# Patient Record
Sex: Female | Born: 1980 | Race: White | Hispanic: No | Marital: Married | Smoking: Never smoker
Health system: Southern US, Community
[De-identification: ages and names within clinical notes are randomized; demographics above are authoritative.]

## PROBLEM LIST (undated history)

## (undated) DIAGNOSIS — L509 Urticaria, unspecified: Secondary | ICD-10-CM

## (undated) DIAGNOSIS — J309 Allergic rhinitis, unspecified: Secondary | ICD-10-CM

## (undated) DIAGNOSIS — E7800 Pure hypercholesterolemia, unspecified: Secondary | ICD-10-CM

## (undated) HISTORY — DX: Pure hypercholesterolemia, unspecified: E78.00

## (undated) HISTORY — DX: Allergic rhinitis, unspecified: J30.9

## (undated) HISTORY — DX: Urticaria, unspecified: L50.9

---

## 2007-10-09 HISTORY — PX: CHOLECYSTECTOMY: SHX55

## 2016-01-02 ENCOUNTER — Ambulatory Visit (INDEPENDENT_AMBULATORY_CARE_PROVIDER_SITE_OTHER): Payer: BLUE CROSS/BLUE SHIELD | Admitting: Internal Medicine

## 2016-01-02 ENCOUNTER — Encounter: Payer: Self-pay | Admitting: Internal Medicine

## 2016-01-02 VITALS — BP 126/74 | HR 96 | Temp 98.1°F | Resp 20 | Ht 63.78 in | Wt 218.4 lb

## 2016-01-02 DIAGNOSIS — L500 Allergic urticaria: Secondary | ICD-10-CM | POA: Diagnosis not present

## 2016-01-02 DIAGNOSIS — J3089 Other allergic rhinitis: Secondary | ICD-10-CM | POA: Diagnosis not present

## 2016-01-02 DIAGNOSIS — J310 Chronic rhinitis: Secondary | ICD-10-CM | POA: Insufficient documentation

## 2016-01-02 MED ORDER — MONTELUKAST SODIUM 10 MG PO TABS: ORAL_TABLET | ORAL | Status: DC

## 2016-01-02 NOTE — Patient Instructions (Signed)
Allergic rhinitis  Continue montelukast 10 mg daily and fluticasone one spray each nostril twice a day  May use nasal saline lavage prior to nasal sprays  Check specific IgE to environmental allergens.  Consider allergy injections if she is a good candidate  Allergic urticaria  Use free and clear products  Start loratadine (Claritin) 10 mg in the morning and cetirizine (Zyrtec) 10 mg in the evening  May add Benadryl as needed  Continue Singulair as above  We'll check a high-risk food allergen panel but low likelihood of food allergy  If symptoms persist, will consider evaluation for rare causes of urticaria next visit  See if NSAIDs cause itching. List given

## 2016-01-02 NOTE — Assessment & Plan Note (Addendum)
   Use free and clear products  Start loratadine (Claritin) 10 mg in the morning and cetirizine (Zyrtec) 10 mg in the evening  May add Benadryl as needed  Continue Singulair as above  We'll check a high-risk food allergen panel but low likelihood of food allergy  If symptoms persist, will consider evaluation for rare causes of urticaria next visit  See if NSAIDs cause itching. List given

## 2016-01-02 NOTE — Progress Notes (Signed)
Referring provider: No referring provider defined for this encounter.  History of Present Illness:  Lori Williamson is a 35 y.o. female presenting for evaluation of rhinoconjunctivitis and urticaria.  HPI Comments: Rhinitis/recurrent sinus infections: Patient has had symptoms for many years but in the recent past, she feels that her symptoms have intensified. She has 1-2 sinus infections a year with transient improvement with antibiotics. She has had positive skin testing at an outside office which was positive for tree pollen, grass pollen, mold, weed pollen, dust mite, mouse epithelia, horse, cockroach. She has used cetirizine in the past with some improvement. She was recently started on fluticasone nasal spray and montelukast past but is too early to tell if it is helping.  Urticaria: For the last several years, she has had intensifying symptoms. She has tried Zantac daily without improvement. Cetirizine does help but she has breakthrough symptoms on a daily basis. She cannot identify any specific triggers such as foods, medications, products.   No current outpatient prescriptions on file prior to visit.   No current facility-administered medications on file prior to visit.    Assessment and Plan: Allergic rhinitis  Continue montelukast 10 mg daily and fluticasone one spray each nostril twice a day  May use nasal saline lavage prior to nasal sprays  Check specific IgE to environmental allergens.  Consider allergy injections if she is a good candidate  Allergic urticaria  Use free and clear products  Start loratadine (Claritin) 10 mg in the morning and cetirizine (Zyrtec) 10 mg in the evening  May add Benadryl as needed  Continue Singulair as above  We'll check a high-risk food allergen panel but low likelihood of food allergy  If symptoms persist, will consider evaluation for rare causes of urticaria next visit  See if NSAIDs cause itching. List given    Return  in about 4 weeks (around 01/30/2016).  Meds ordered this encounter  Medications  . clarithromycin (BIAXIN) 500 MG tablet    Sig:   . fluticasone (FLONASE) 50 MCG/ACT nasal spray    Sig:   . montelukast (SINGULAIR) 10 MG tablet    Sig:   . 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 No distress.  HENT:  Right Ear: External ear normal.  Left Ear: External ear normal.  Mouth/Throat: Oropharynx is clear and moist.  Nasal membrane edema, erythema with clear rhinorrhea  Eyes: Conjunctivae are normal. Right eye exhibits no discharge. Left eye exhibits no discharge.  Cardiovascular: Normal rate, regular rhythm and normal heart sounds.   No murmur heard. Pulmonary/Chest: Effort normal and breath sounds normal. No respiratory distress. She has no wheezes. She has no rales.  Abdominal: Soft. Bowel sounds are normal.  Musculoskeletal: She exhibits no edema.  Lymphadenopathy:    She has no cervical adenopathy.  Neurological: She is alert.  Skin: Rash: Dermatographia   Vitals reviewed.   Review of systems: Per HPI unless specifically indicated below Review of Systems  Constitutional: Negative for fever, chills, appetite change and unexpected weight change.  HENT: Positive for congestion, postnasal  drip, rhinorrhea, sinus pressure and sneezing. Negative for ear pain and sore throat.   Eyes: Positive for discharge and itching. Negative for pain.  Respiratory: Negative for cough, chest tightness and wheezing.   Cardiovascular: Negative for chest pain and leg swelling.  Gastrointestinal: Negative for vomiting and  diarrhea.  Genitourinary: Negative for difficulty urinating.  Musculoskeletal: Negative for joint swelling and arthralgias.  Skin: Negative for rash.  Allergic/Immunologic: Positive for environmental allergies. Negative for food allergies and immunocompromised state.       Stung by yellow jacket, wasp - no problems No latex allergy  Neurological: Negative for seizures.    Past medical history:  Patient Active Problem List   Diagnosis Date Noted  . Allergic rhinitis 01/02/2016  . Allergic urticaria 01/02/2016    Past surgical history: Past Surgical History  Procedure Laterality Date  . Cholecystectomy  2009    Family history: Family History  Problem Relation Age of Onset  . Diabetes Mother   . Hypertension Mother   . Hypercholesterolemia Mother   . Allergic rhinitis Father   . Allergic rhinitis Brother   . Hypercholesterolemia Brother   . Angioedema Neg Hx   . Asthma Neg Hx   . Eczema Neg Hx   . Immunodeficiency Neg Hx   . Urticaria Neg Hx     Environmental/Social history: She lives in a house that is 34 years of age, she has a non-feather pillow and comforter, there is carpet in the home, there is central air conditioning and heating, there is an outdoor dog, there is a finished basement, she is an Art gallery managerengineer.  Drug Allergies:  Allergies  Allergen Reactions  . Sulfa Antibiotics Nausea And Vomiting    Thank you for the opportunity to care for this patient.  Please do not hesitate to contact me with questions.

## 2016-01-02 NOTE — Assessment & Plan Note (Addendum)
   Continue montelukast 10 mg daily and fluticasone one spray each nostril twice a day  May use nasal saline lavage prior to nasal sprays  Check specific IgE to environmental allergens.  Consider allergy injections if she is a good candidate

## 2016-01-04 ENCOUNTER — Telehealth: Payer: Self-pay | Admitting: *Deleted

## 2016-01-04 NOTE — Telephone Encounter (Signed)
Informed patient to Dr. Shari ProwsBhatti's note written below. Patient was fine with that and will let us know.

## 2016-01-04 NOTE — Telephone Encounter (Signed)
Patient states she is doing every thing she was told to do on Monday. Patient states she is having a lot of ear pressure, ear popping, head congestion, body aches and joint pain. She finished an antibiotic last Tuesday then went to the urgent care again last Friday and got another antibiotic. She states she is getting cold chills and having to stay under a blanket. Her fever has been 98.7 and then again 99.1. Patient states she is worried because normally an antibiotic will make her feel better immediately. Please advise.

## 2016-01-04 NOTE — Telephone Encounter (Signed)
Informed patient that she should finish the antibiotic that was recently prescribed. If she is still having symptoms after that, let us now

## 2016-01-06 LAB — ALLERGENS, ZONE 3
Alternaria Alternata IgE: <0.1 kU/L
Aspergillus Fumigatus IgE: <0.1 kU/L
Bahia Grass IgE: <0.1 kU/L
Bermuda Grass IgE: <0.1 kU/L
Cat Dander IgE: <0.1 kU/L
Cedar, Mountain IgE: <0.1 kU/L
Cockroach, American IgE: <0.1 kU/L
D Pteronyssinus IgE: <0.1 kU/L
Dog Dander IgE: <0.1 kU/L
Elm, American IgE: <0.1 kU/L
Hickory, White IgE: <0.1 kU/L
Johnson Grass IgE: <0.1 kU/L
Kentucky Bluegrass IgE: <0.1 kU/L
Maple/Box Elder IgE: <0.1 kU/L
Mucor Racemosus IgE: <0.1 kU/L
Penicillium Chrysogen IgE: <0.1 kU/L
White Mulberry IgE: <0.1 kU/L

## 2016-01-06 LAB — FOOD ALLERGY PROFILE
Allergen Corn, IgE: <0.1 kU/L
Clam IgE: <0.1 kU/L
Milk IgE: <0.1 kU/L
Soybean IgE: <0.1 kU/L
Walnut IgE: <0.1 kU/L

## 2016-01-09 ENCOUNTER — Telehealth: Payer: Self-pay

## 2016-01-09 NOTE — Telephone Encounter (Signed)
Patient calling back after given lab results. Pt. Wanting to know if taken an antihistamine interfered with the negative lab results and I spoke with Dr. Clydie BraunBhatti and she said no. Pt. Just wanted to make sure.Pt. Had taken a zyrtec around 11:00 am and got her blood drawn around 2 to 3 hours later. Pt. Just frustrated bc she is dealing with on going hives. Pt. Has a return appointment in April with Dr. Clydie BraunBhatti in which they will discuss what to do next.

## 2016-01-30 ENCOUNTER — Telehealth: Payer: Self-pay

## 2016-01-30 ENCOUNTER — Encounter: Payer: Self-pay | Admitting: Internal Medicine

## 2016-01-30 ENCOUNTER — Ambulatory Visit (INDEPENDENT_AMBULATORY_CARE_PROVIDER_SITE_OTHER): Payer: BLUE CROSS/BLUE SHIELD | Admitting: Internal Medicine

## 2016-01-30 VITALS — BP 114/70 | HR 80 | Temp 98.2°F | Resp 16

## 2016-01-30 DIAGNOSIS — J310 Chronic rhinitis: Secondary | ICD-10-CM | POA: Diagnosis not present

## 2016-01-30 DIAGNOSIS — L500 Allergic urticaria: Secondary | ICD-10-CM

## 2016-01-30 LAB — CBC WITH DIFFERENTIAL/PLATELET
BASOS ABS: 0 {cells}/uL (ref 0–200)
Basophils Relative: 0 %
EOS ABS: 85 {cells}/uL (ref 15–500)
Eosinophils Relative: 1 %
HCT: 39.8 % (ref 35.0–45.0)
HEMOGLOBIN: 13.6 g/dL (ref 11.7–15.5)
LYMPHS ABS: 1870 {cells}/uL (ref 850–3900)
Lymphocytes Relative: 22 %
MCH: 30.6 pg (ref 27.0–33.0)
MCHC: 34.2 g/dL (ref 32.0–36.0)
MCV: 89.4 fL (ref 80.0–100.0)
MONOS PCT: 5 %
MPV: 10.1 fL (ref 7.5–12.5)
Monocytes Absolute: 425 cells/uL (ref 200–950)
NEUTROS ABS: 6120 {cells}/uL (ref 1500–7800)
Neutrophils Relative %: 72 %
Platelets: 308 10*3/uL (ref 140–400)
RBC: 4.45 MIL/uL (ref 3.80–5.10)
RDW: 13.1 % (ref 11.0–15.0)
WBC: 8.5 10*3/uL (ref 3.8–10.8)

## 2016-01-30 LAB — COMPREHENSIVE METABOLIC PANEL
ALBUMIN: 4.2 g/dL (ref 3.6–5.1)
ALT: 11 U/L (ref 6–29)
AST: 14 U/L (ref 10–30)
Alkaline Phosphatase: 65 U/L (ref 33–115)
BUN: 8 mg/dL (ref 7–25)
CHLORIDE: 105 mmol/L (ref 98–110)
CO2: 23 mmol/L (ref 20–31)
Calcium: 8.4 mg/dL — ABNORMAL LOW (ref 8.6–10.2)
Creat: 0.81 mg/dL (ref 0.50–1.10)
Glucose, Bld: 122 mg/dL — ABNORMAL HIGH (ref 65–99)
POTASSIUM: 3.9 mmol/L (ref 3.5–5.3)
Sodium: 139 mmol/L (ref 135–146)
TOTAL PROTEIN: 6.1 g/dL (ref 6.1–8.1)
Total Bilirubin: 0.4 mg/dL (ref 0.2–1.2)

## 2016-01-30 LAB — SEDIMENTATION RATE: Sed Rate: 8 mm/hr (ref 0–20)

## 2016-01-30 MED ORDER — MONTELUKAST SODIUM 10 MG PO TABS: 10.0000 mg | ORAL_TABLET | Freq: Every day | ORAL | Status: DC

## 2016-01-30 NOTE — Assessment & Plan Note (Addendum)
   Currently well controlled  Continue cetirizine and loratadine, Singulair  Check for rare causes. In addition to labs above, we'll also check CU index profile, tryptase, alpha gal antibodies  She will check with gastroenterologist regarding use of prevalite

## 2016-01-30 NOTE — Assessment & Plan Note (Addendum)
   Continue to use cetirizine, Claritin, Flonase as needed  Because of recurrent infections, we will screen immune system. Check CBC with differential, CMP, ESR, tetanus/pneumococcus/diphtheria titers, total IgG G/A/M

## 2016-01-30 NOTE — Telephone Encounter (Signed)
Pt. Called me back and understood all information I left on her cell # and pt. Wanted me to call out refill on montelukast to the prevo drug in [place] in which I told pt. I would. Told pt. If she needs me to send office notes once she gets an appointment with Dr. Konrad FelixBrad Thomas in [place] just to give me a call and will send office notes.

## 2016-01-30 NOTE — Progress Notes (Signed)
History of Present Illness: Lori Williamson is a 35 y.o. female presenting for follow-up  HPI Comments: Chronic rhinitis/recurrent sinus infections: Specific IgE to environmental allergens was all negative at last visit. She is taking antihistamines along with montelukast and fluticasone with good symptom control. No interval sinus infections  Urticaria: Labwork was negative for common foods. She is using Claritin in the morning and cetirizine in the evening along with Singulair and has been having good symptom control. In addition she is avoiding NSAIDs. She recently restarted Prevalite and feels that is helping her pruritus along with her IBS.   Current Outpatient Prescriptions on File Prior to Visit  Medication Sig Dispense Refill  . fluticasone (FLONASE) 50 MCG/ACT nasal spray     . montelukast (SINGULAIR) 10 MG tablet     . clarithromycin (BIAXIN) 500 MG tablet Reported on 01/30/2016     No current facility-administered medications on file prior to visit.    Assessment and Plan: Chronic rhinitis  Continue to use cetirizine, Claritin, Flonase as needed  Because of recurrent infections, we will screen immune system. Check CBC with differential, CMP, ESR, tetanus/pneumococcus/diphtheria titers, total IgG G/A/M  Allergic urticaria  Currently well controlled  Continue cetirizine and loratadine, Singulair  Check for rare causes. In addition to labs above, we'll also check CU index profile, tryptase, alpha gal antibodies  She will check with gastroenterologist regarding use of prevalite    Return in about 3 months (around 04/30/2016).  Meds ordered this encounter  Medications  . loratadine (CLARITIN) 10 MG tablet    Sig: Take 10 mg by mouth daily. Every morning  . cetirizine (ZYRTEC) 10 MG tablet    Sig: Take 10 mg by mouth daily. Every evening  . diphenhydrAMINE (BENADRYL) 25 mg capsule    Sig: Take 25 mg by mouth as needed. For breakthru  . vitamin C (ASCORBIC ACID)  500 MG tablet    Sig: Take 500 mg by mouth daily. Takes 2 when sick  . Probiotic Product (PROBIOTIC ADVANCED PO)    Sig: Take by mouth 3 x daily with food.  . cholestyramine light (PREVALITE) 4 GM/DOSE powder    Sig: Take 4 g by mouth daily. It has helped stop the diarrhea and rectal bleeding along the urge to scratch    Physical Exam: BP 114/70 mmHg  Pulse 80  Temp(Src) 98.2 F (36.8 C) (Oral)  Resp 16   Physical Exam  Constitutional: She appears well-developed and well-nourished. No distress.  HENT:  Right Ear: External ear normal.  Left Ear: External ear normal.  Nose: Nose normal.  Mouth/Throat: Oropharynx is clear and moist.  Eyes: Conjunctivae are normal. Right eye exhibits no discharge. Left eye exhibits no discharge.  Cardiovascular: Normal rate, regular rhythm and normal heart sounds.   No murmur heard. Pulmonary/Chest: Effort normal and breath sounds normal. No respiratory distress. She has no wheezes. She has no rales.  Abdominal: Soft. Bowel sounds are normal.  Musculoskeletal: She exhibits no edema.  Lymphadenopathy:    She has no cervical adenopathy.  Neurological: She is alert.  Skin: No rash noted.  Vitals reviewed.   Patient Active Problem List   Diagnosis Date Noted  . Chronic rhinitis 01/02/2016  . Allergic urticaria 01/02/2016    Drug Allergies:  Allergies  Allergen Reactions  . Sulfa Antibiotics Nausea And Vomiting    ROS: Per HPI unless specifically indicated below Review of Systems  Thank you for the opportunity to care for this patient.  Please do not hesitate  to contact me with questions.

## 2016-01-30 NOTE — Telephone Encounter (Signed)
Left message for patients appointment information with  Dr. Chales AbrahamsGupta the [place] Dr. [place] and information regarding PCP information for patient to call me to make sure she understood all information I left on her cell #.

## 2016-01-30 NOTE — Patient Instructions (Signed)
Chronic rhinitis  Continue to use cetirizine, Claritin, Flonase as needed  Because of recurrent infections, we will screen immune system. Check CBC with differential, CMP, ESR, tetanus/pneumococcus/diphtheria titers, total IgG G/A/M  Allergic urticaria  Currently well controlled  Continue cetirizine and loratadine, Singulair  Check for rare causes. In addition to labs above, we'll also check CU index profile, tryptase, alpha gal antibodies  She will check with gastroenterologist regarding use of prevalite

## 2016-01-31 LAB — IGE: IGE (IMMUNOGLOBULIN E), SERUM: 26 kU/L (ref <115)

## 2016-01-31 LAB — TRYPTASE: TRYPTASE: 4.2 ug/L (ref <11)

## 2016-01-31 LAB — IGG, IGA, IGM
IGM, SERUM: 133 mg/dL (ref 52–322)
IgA: 155 mg/dL (ref 69–380)
IgG (Immunoglobin G), Serum: 954 mg/dL (ref 690–1700)

## 2016-01-31 LAB — SEDIMENTATION RATE: Sed Rate: 5 mm/hr (ref 0–20)

## 2016-02-01 LAB — DIPHTHERIA / TETANUS ANTIBODY PANEL
Diphtheria Ab: 0.16 IU/mL
TETANUS TOXIN ANTIBODY, TOTAL: 2.36 [IU]/mL (ref >0.15)

## 2016-02-02 ENCOUNTER — Telehealth: Payer: Self-pay | Admitting: *Deleted

## 2016-02-02 DIAGNOSIS — L500 Allergic urticaria: Secondary | ICD-10-CM

## 2016-02-02 LAB — ALPHA-GAL PANEL
Allergen, Mutton, f88: <0.1 kU/L
Allergen, Pork, f26: <0.1 kU/L
Galactose-alpha-1,3-galactose IgE: <0.1 kU/L (ref <0.35)

## 2016-02-02 LAB — COMPLEMENT, TOTAL: Compl, Total (CH50): 58 U/mL (ref 31–60)

## 2016-02-02 LAB — CP CHRONIC URTICARIA INDEX PANEL
THYROID PEROXIDASE ANTIBODY: 1 [IU]/mL (ref <9)
TSH: 1.52 m[IU]/L
Thyroglobulin Ab: <1 IU/mL (ref <2)

## 2016-02-02 NOTE — Telephone Encounter (Signed)
Left message for pt to call back, need to let her know I reordered a lab test for chronic urticaria and will need to go back to [facility] for redraw.

## 2016-02-02 NOTE — Telephone Encounter (Signed)
[facility] called and said they did not collect enough specimen to do the CP chronic urticaria blood test. Pt will need to go for another blood draw. I will reorder.

## 2016-02-03 LAB — STREP PNEUMONIAE 14 SEROTYPES IGG
Strep pneumo Type 12: <0.3 ug/mL
Strep pneumo Type 19: 0.5 ug/mL
Strep pneumo Type 9: <0.3 ug/mL
Strep pneumoniae Type 1 Abs: 1.5 ug/mL
Strep pneumoniae Type 14 Abs: 2.3 ug/mL
Strep pneumoniae Type 18C Abs: <0.3 ug/mL
Strep pneumoniae Type 23F Abs: 6.6 ug/mL
Strep pneumoniae Type 5 Abs: 1.2 ug/mL
Strep pneumoniae Type 6B Abs: <0.3 ug/mL
Strep pneumoniae Type 7F Abs: <0.3 ug/mL

## 2016-02-03 NOTE — Telephone Encounter (Signed)
Spoke with lab and the histamine release will have to be redrawn, pt is aware. Followed up on the titer results and they will not be back until the first of next week.

## 2016-02-10 ENCOUNTER — Telehealth: Payer: Self-pay

## 2016-02-10 NOTE — Telephone Encounter (Signed)
Patient called this morning to let us know she had talked to her Recruitment consultanthuman resource manager at work and in which they stated that she may have to go on short term disability because of her taking cetirizine at night, loratadine in the morning and benadryl during the day for break through from the hives. 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 I told pt. We are not the one's that put pt.'s on short term disability and it's usually the PCP practice that usually determines that. Pt. Seeing a NP at Dr. Maurine Ministerhomas's office and seeing the GI Dr. On May 16th. The pt. States she works about 60 to 80 hours per week and feels that if she could cut back on her hours until the Dr.'s can figure out regarding the hives and the rectal bleeding in which the pt. Has been having. Please advise

## 2016-02-13 LAB — CP CHRONIC URTICARIA INDEX PANEL
Histamine Release: <16 % (ref <16)
TSH: 1.31 mIU/L
Thyroglobulin Ab: <1 IU/mL (ref <2)
Thyroperoxidase Ab SerPl-aCnc: <1 IU/mL (ref <9)

## 2016-02-13 NOTE — Telephone Encounter (Signed)
At last visit, discussed that patient should be doing cetirizine at night, loratadine in the morning and Singulair at night. On this regimen, she reported good symptom control. 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 Could also consider addition of Xolair for uncontrolled urticaria.

## 2016-02-13 NOTE — Telephone Encounter (Signed)
Spoke to patient to let her know that the regimen that pt. Reported good symptom control with the hives. Told pt. 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 Pt. That the loratadine was making her loopy, lethargic. Dr. Clydie BraunBhatti stated she could do another cetirizine or allegra in place of the loratadine. Pt. Was ok with that. I also told pt. About xolair for hives. Pt. Will check with insurance and get back with us.

## 2016-02-16 ENCOUNTER — Encounter: Payer: Self-pay | Admitting: Internal Medicine

## 2016-02-24 ENCOUNTER — Telehealth: Payer: Self-pay

## 2016-02-24 NOTE — Telephone Encounter (Signed)
Pt. Calling to let us know she saw Dr.Gupta the GI Dr. and pt. Will be having a colonoscopy on Monday. Pt. 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 Chales AbrahamsGupta wants to refer pt to a dermatologist for skin bx's. Dr. Chales AbrahamsGupta doesn't want us to give her xolair injection(s) while doing all these procedures and bx's.  Dr. Chales AbrahamsGupta stated also they maybe referring pt to [facility] or [place]. Told pt. That her Geoffry Paradisexolair was here in office and to let us know if whether or not we will be giving her xolair eventually. I will let the girls know in the shot room.

## 2016-02-27 NOTE — Telephone Encounter (Signed)
Noted  

## 2016-03-30 ENCOUNTER — Telehealth: Payer: Self-pay

## 2016-03-30 NOTE — Telephone Encounter (Signed)
Pt. Had her 1st initial visit on 01/02/16 with Dr.Bhatti for allergic rhinitis and hives.  Since pt had no pcp we referred pt. Out to Dr. Maisie Fushomas for pcp and Dr. [place]ales Abrah[place]Gupta [place]. Dr. Maisie Fushomas ordered a bx material submitted was Flank,right to r/o mastocytosis. Please see results. Pt. Was seeing  Dr.Gupta for GI issues for the last 9 years and had a colon bx which came back benign. Pt also had a pnuemovax injection and in which pt. States she's is feeling much better and with more energy. 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 Pt. States she hives up if she comes out of a shower or if she scratches herself also if she stops taking her antihistamines the hives return.  Please review notes and advise.

## 2016-04-02 NOTE — Telephone Encounter (Signed)
PT CALLED BACK.  SHE HAS ALREADY WORKED WITH [employer] AND GOT XOLAIR APPROVED.  WE HAVE HER FIRST DOSE HERE.  PT SCHEDULED HER FIRST INJECTION ON 04/03/16 AT 10 AM.  DAMITA SPOKE WITH PT AND GAVE ALL INFO FOR FIRST START XOLAIR INJECTION.  DR Beaulah DinningBARDELAS INFORMED.

## 2016-04-02 NOTE — Telephone Encounter (Signed)
LEFT MESSAGE FOR PT TO CALL BACK TO MAKE A RETURN OV WITH DR Beaulah DinningBARDELAS.

## 2016-04-02 NOTE — Telephone Encounter (Signed)
Call patient. The skin biopsy did not show changes seen in mastocytosis. If she continues to have hives, Xolair would be helpful ; however we need to document what medications she is taking and failed for insurance to cover Xolair. She should make a follow-up appointment

## 2016-04-03 ENCOUNTER — Ambulatory Visit (INDEPENDENT_AMBULATORY_CARE_PROVIDER_SITE_OTHER): Payer: BLUE CROSS/BLUE SHIELD

## 2016-04-03 DIAGNOSIS — L500 Allergic urticaria: Secondary | ICD-10-CM | POA: Diagnosis not present

## 2016-04-03 MED ORDER — OMALIZUMAB 150 MG ~~LOC~~ SOLR
300.0000 mg | SUBCUTANEOUS | Status: AC
  Administered 2016-04-03 – 2024-03-23 (×83): 300 mg via SUBCUTANEOUS

## 2016-04-23 ENCOUNTER — Other Ambulatory Visit: Payer: Self-pay | Admitting: Internal Medicine

## 2016-04-26 LAB — STREP PNEUMONIAE 14 SEROTYPES IGG
STREP PNEUMO TYPE 19: 6.3
STREP PNEUMO TYPE 4: 1.6
STREP PNEUMONIAE TYPE 1 ABS: 101.8
STREP PNEUMONIAE TYPE 14 ABS: 68.9
STREP PNEUMONIAE TYPE 18C ABS: 0.3
STREP PNEUMONIAE TYPE 23F ABS: 7.3
STREP PNEUMONIAE TYPE 6B ABS: 19.8
STREP PNEUMONIAE TYPE 7F ABS: 3
STREP PNEUMONIAE TYPE 9N ABS: 6.2
Strep pneumo Type 9: 1.2
Strep pneumoniae Type 3 Abs: 0.9
Strep pneumoniae Type 5 Abs: 96.2
Strep pneumoniae Type 8 Abs: <0.3

## 2016-04-30 ENCOUNTER — Encounter (INDEPENDENT_AMBULATORY_CARE_PROVIDER_SITE_OTHER): Payer: BLUE CROSS/BLUE SHIELD

## 2016-04-30 ENCOUNTER — Encounter: Payer: Self-pay | Admitting: Pediatrics

## 2016-04-30 ENCOUNTER — Ambulatory Visit (INDEPENDENT_AMBULATORY_CARE_PROVIDER_SITE_OTHER): Payer: BLUE CROSS/BLUE SHIELD | Admitting: Pediatrics

## 2016-04-30 VITALS — BP 110/70 | HR 76 | Temp 98.5°F | Resp 18

## 2016-04-30 DIAGNOSIS — L500 Allergic urticaria: Secondary | ICD-10-CM | POA: Diagnosis not present

## 2016-04-30 DIAGNOSIS — L503 Dermatographic urticaria: Secondary | ICD-10-CM | POA: Diagnosis not present

## 2016-04-30 DIAGNOSIS — J310 Chronic rhinitis: Secondary | ICD-10-CM

## 2016-04-30 NOTE — Patient Instructions (Addendum)
Continue on the current medications Continue on Xolair once a month Follow-up in 3 months to see if we can reduce her medications

## 2016-04-30 NOTE — Progress Notes (Signed)
This encounter was created in error - please disregard.

## 2016-04-30 NOTE — Progress Notes (Signed)
  [address] [place] [address] Dept: [phone]  FOLLOW UP NOTE  Patient ID: Lori Williamson, female    DOB: November 24, 1980  Age: 35 y.o. MRN: [id number] Date of Office Visit: 04/30/2016  Assessment  Chief Complaint: Urticaria (f/u hives doing better)  HPI Lori Williamson presents for follow-up of urticaria. She has had 1 Xolair injection and  her symptoms are much improved. She had normal Pneumovax antibody titers. All of her lab work ordered by Dr. Clydie Braun returned  as  normal. 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 Her nasal symptoms are well controlled  Current medications cetirizine 10 mg at night and loratadine 10 mg once in the morning , fluticasone 2 sprays per nostril once a day, montelukast 10 mg once a day.    Drug Allergies:  Allergies  Allergen Reactions  . Sulfa Antibiotics Nausea And Vomiting    Physical Exam: BP 110/70   Pulse 76   Temp 98.5 F (36.9 C) (Oral)   Resp 18    Physical Exam  Constitutional: She is oriented to person, place, and time. She appears well-developed and well-nourished.  HENT:  Eyes normal. Ears normal. Nose normal. Pharynx normal.  Neck: Neck supple. No thyromegaly present.  Cardiovascular:  S1 and S2 normal no murmurs  Pulmonary/Chest:  Clear to percussion and auscultation  Lymphadenopathy:    She has no cervical adenopathy.  Neurological: She is alert and oriented to person, place, and time.  Skin:  A few small hives  Psychiatric: She has a normal mood and affect. Her behavior is normal. Judgment and thought content normal.  Vitals reviewed.   Diagnostics:  none  Assessment and Plan: 1. Allergic urticaria   2. Dermographia   3. Chronic rhinitis       Patient Instructions  Continue on the current medications Continue on Xolair once a month Follow-up in 3 months to see if we can reduce her medications    Return in about 3 months (around 07/31/2016).    Thank you for the opportunity to care  for this patient.  Please do not hesitate to contact me with questions.  Tonette Bihari, M.D.  [facility] [address] [place], [place] [address] [phone]

## 2016-05-28 ENCOUNTER — Ambulatory Visit (INDEPENDENT_AMBULATORY_CARE_PROVIDER_SITE_OTHER): Payer: BLUE CROSS/BLUE SHIELD

## 2016-05-28 DIAGNOSIS — L500 Allergic urticaria: Secondary | ICD-10-CM | POA: Diagnosis not present

## 2016-06-25 ENCOUNTER — Ambulatory Visit (INDEPENDENT_AMBULATORY_CARE_PROVIDER_SITE_OTHER): Payer: BLUE CROSS/BLUE SHIELD

## 2016-06-25 DIAGNOSIS — L500 Allergic urticaria: Secondary | ICD-10-CM | POA: Diagnosis not present

## 2016-07-23 ENCOUNTER — Ambulatory Visit (INDEPENDENT_AMBULATORY_CARE_PROVIDER_SITE_OTHER): Payer: BLUE CROSS/BLUE SHIELD

## 2016-07-23 DIAGNOSIS — L500 Allergic urticaria: Secondary | ICD-10-CM | POA: Diagnosis not present

## 2016-08-20 ENCOUNTER — Ambulatory Visit (INDEPENDENT_AMBULATORY_CARE_PROVIDER_SITE_OTHER): Payer: BLUE CROSS/BLUE SHIELD

## 2016-08-20 ENCOUNTER — Ambulatory Visit: Payer: BLUE CROSS/BLUE SHIELD

## 2016-08-20 DIAGNOSIS — L500 Allergic urticaria: Secondary | ICD-10-CM

## 2016-09-17 ENCOUNTER — Ambulatory Visit (INDEPENDENT_AMBULATORY_CARE_PROVIDER_SITE_OTHER): Payer: BLUE CROSS/BLUE SHIELD

## 2016-09-17 DIAGNOSIS — L500 Allergic urticaria: Secondary | ICD-10-CM | POA: Diagnosis not present

## 2016-10-15 ENCOUNTER — Ambulatory Visit (INDEPENDENT_AMBULATORY_CARE_PROVIDER_SITE_OTHER): Payer: BLUE CROSS/BLUE SHIELD

## 2016-10-15 DIAGNOSIS — L500 Allergic urticaria: Secondary | ICD-10-CM

## 2016-10-15 DIAGNOSIS — L501 Idiopathic urticaria: Secondary | ICD-10-CM

## 2016-10-29 ENCOUNTER — Ambulatory Visit: Payer: BLUE CROSS/BLUE SHIELD | Admitting: Pediatrics

## 2016-10-29 ENCOUNTER — Ambulatory Visit (INDEPENDENT_AMBULATORY_CARE_PROVIDER_SITE_OTHER): Payer: BLUE CROSS/BLUE SHIELD | Admitting: Pediatrics

## 2016-10-29 ENCOUNTER — Encounter: Payer: Self-pay | Admitting: Pediatrics

## 2016-10-29 VITALS — BP 100/74 | HR 86 | Temp 98.2°F | Resp 16

## 2016-10-29 DIAGNOSIS — J310 Chronic rhinitis: Secondary | ICD-10-CM | POA: Diagnosis not present

## 2016-10-29 DIAGNOSIS — L508 Other urticaria: Secondary | ICD-10-CM

## 2016-10-29 DIAGNOSIS — L503 Dermatographic urticaria: Secondary | ICD-10-CM

## 2016-10-29 NOTE — Patient Instructions (Addendum)
Zyrtec 10 mg once a day for itching Fluticasone 2 sprays per nostril once a day if needed for stuffy nose Continue Xolair Lotrimin 1% cream twice a day for 2 weeks to the hyperpigmented area in your back in case this is a skin fungal infection May need an additional 2 weeks of treatment Call me if you are not doing well on this treatment plan

## 2016-10-29 NOTE — Progress Notes (Signed)
  [address] [place] [address] Dept: [phone]  FOLLOW UP NOTE  Patient ID: Lori BlankKaren Loflin-Strayhorn, female    DOB: 04/02/1981  Age: 36 y.o. MRN: [id number] Date of Office Visit: 10/29/2016  Assessment  Chief Complaint: Urticaria  HPI Lori BlankKaren Loflin-Strayhorn presents for follow-up of chronic urticaria. She is doing well with  Xolair injections once a month. She also finds that her sinus disease has  dramatically improved with the Xolair injections. She does not need to use montelukast.  Current medications are cetirizine 10 mg at night, fluticasone 2 sprays per nostril once a day if needed and montelukast 10 mg once a day.   Drug Allergies:  Allergies  Allergen Reactions  . Sulfa Antibiotics Nausea And Vomiting    Physical Exam: BP 100/74   Pulse 86   Temp 98.2 F (36.8 C) (Oral)   Resp 16   SpO2 96%    Physical Exam  Constitutional: She is oriented to person, place, and time. She appears well-developed and well-nourished.  HENT:  Eyes normal. Ears normal. Nose normal. Pharynx normal  Neck: Neck supple. No thyromegaly present.  Cardiovascular:  S1 and S2 normal no murmurs  Pulmonary/Chest:  Clear to percussion and auscultation  Lymphadenopathy:    She has no cervical adenopathy.  Neurological: She is alert and oriented to person, place, and time.  Skin:  No urticaria noted. She had a 2 by 1 inch papular hyperpigmented area in her mid back  Psychiatric: She has a normal mood and affect. Her behavior is normal. Judgment and thought content normal.  Vitals reviewed.   Diagnostics:  none  Assessment and Plan: 1. Chronic urticaria   2. Dermographia   3. Other chronic rhinitis     No orders of the defined types were placed in this encounter.   Patient Instructions  Zyrtec 10 mg once a day for itching Fluticasone 2 sprays per nostril once a day if needed for stuffy nose Continue Xolair Lotrimin 1% cream twice a day for 2 weeks to the hyperpigmented  area in your back in case this is a skin fungal infection May need an additional 2 weeks of treatment Call me if you are not doing well on this treatment plan   Return in about 6 months (around 04/28/2017).    Thank you for the opportunity to care for this patient.  Please do not hesitate to contact me with questions.  Tonette BihariJ. A. Haruka Kowaleski, M.D.  [facility] [address] [place], [place] [address] [phone]

## 2016-11-12 ENCOUNTER — Ambulatory Visit: Payer: BLUE CROSS/BLUE SHIELD

## 2016-11-12 ENCOUNTER — Ambulatory Visit (INDEPENDENT_AMBULATORY_CARE_PROVIDER_SITE_OTHER): Payer: BLUE CROSS/BLUE SHIELD

## 2016-11-12 DIAGNOSIS — L501 Idiopathic urticaria: Secondary | ICD-10-CM | POA: Diagnosis not present

## 2016-11-12 DIAGNOSIS — L508 Other urticaria: Secondary | ICD-10-CM

## 2016-12-07 ENCOUNTER — Ambulatory Visit (INDEPENDENT_AMBULATORY_CARE_PROVIDER_SITE_OTHER): Payer: BLUE CROSS/BLUE SHIELD

## 2016-12-07 DIAGNOSIS — L500 Allergic urticaria: Secondary | ICD-10-CM

## 2016-12-10 ENCOUNTER — Ambulatory Visit: Payer: BLUE CROSS/BLUE SHIELD

## 2017-01-07 ENCOUNTER — Ambulatory Visit (INDEPENDENT_AMBULATORY_CARE_PROVIDER_SITE_OTHER): Payer: BLUE CROSS/BLUE SHIELD

## 2017-01-07 DIAGNOSIS — L500 Allergic urticaria: Secondary | ICD-10-CM | POA: Diagnosis not present

## 2017-02-04 ENCOUNTER — Ambulatory Visit (INDEPENDENT_AMBULATORY_CARE_PROVIDER_SITE_OTHER): Payer: BLUE CROSS/BLUE SHIELD

## 2017-02-04 DIAGNOSIS — L500 Allergic urticaria: Secondary | ICD-10-CM | POA: Diagnosis not present

## 2017-03-01 ENCOUNTER — Ambulatory Visit (INDEPENDENT_AMBULATORY_CARE_PROVIDER_SITE_OTHER): Payer: BLUE CROSS/BLUE SHIELD

## 2017-03-01 DIAGNOSIS — L500 Allergic urticaria: Secondary | ICD-10-CM

## 2017-04-01 ENCOUNTER — Ambulatory Visit (INDEPENDENT_AMBULATORY_CARE_PROVIDER_SITE_OTHER): Payer: BLUE CROSS/BLUE SHIELD | Admitting: *Deleted

## 2017-04-01 DIAGNOSIS — L501 Idiopathic urticaria: Secondary | ICD-10-CM | POA: Diagnosis not present

## 2017-04-01 DIAGNOSIS — J4540 Moderate persistent asthma, uncomplicated: Secondary | ICD-10-CM

## 2017-04-29 ENCOUNTER — Ambulatory Visit (INDEPENDENT_AMBULATORY_CARE_PROVIDER_SITE_OTHER): Payer: BLUE CROSS/BLUE SHIELD | Admitting: Pediatrics

## 2017-04-29 ENCOUNTER — Ambulatory Visit: Payer: BLUE CROSS/BLUE SHIELD

## 2017-04-29 ENCOUNTER — Encounter: Payer: Self-pay | Admitting: Pediatrics

## 2017-04-29 VITALS — BP 108/62 | HR 60 | Temp 97.8°F | Resp 16 | Ht 64.0 in | Wt 163.0 lb

## 2017-04-29 DIAGNOSIS — J4540 Moderate persistent asthma, uncomplicated: Secondary | ICD-10-CM

## 2017-04-29 DIAGNOSIS — J310 Chronic rhinitis: Secondary | ICD-10-CM

## 2017-04-29 DIAGNOSIS — L508 Other urticaria: Secondary | ICD-10-CM

## 2017-04-29 NOTE — Progress Notes (Signed)
  [address] [place] [address] Dept: [phone]  FOLLOW UP NOTE  Patient ID: Lori Williamson, female    DOB: August 07, 1981  Age: 36 y.o. MRN: [id number] Date of Office Visit: 04/29/2017  Assessment  Chief Complaint: Urticaria  HPI Lori Williamson presents for follow-up of chronic urticaria. She has had an excellent response Xolair 300 mg every 28 days. She has not had a sinus infections in the past year. Her nasal symptoms are well controlled.  Current medications are cetirizine 10 mg at night, and fluticasone 2 sprays per nostril once a day if needed   Drug Allergies:  Allergies  Allergen Reactions  . Sulfa Antibiotics Nausea And Vomiting    Physical Exam: BP 108/62   Pulse 60   Temp 97.8 F (36.6 C) (Oral)   Resp 16   Ht 5\' 4"  (1.626 m)   Wt 163 lb (73.9 kg)   BMI 27.98 kg/m    Physical Exam  Constitutional: She is oriented to person, place, and time. She appears well-developed and well-nourished.  HENT:  Eyes normal. Ears normal. Nose normal. Pharynx normal.  Neck: Neck supple. No thyromegaly present.  Cardiovascular:  S1 and S2 normal no murmurs  Pulmonary/Chest:  Clear to percussion and auscultation  Musculoskeletal:  Her right TMJ overlapped  Lymphadenopathy:    She has no cervical adenopathy.  Neurological: She is oriented to person, place, and time.  Skin:  Clear  Psychiatric: She has a normal mood and affect. Judgment and thought content normal.  Vitals reviewed.   Diagnostics:   none  Assessment and Plan: 1. Chronic rhinitis   2. Chronic urticaria        Patient Instructions  Continue on cetirizine 10 mg once a day  Ask  dentist about TMJ syndrome Call me if you are not doing well on this treatment plan Fluticasone 2 sprays per nostril once a day if needed for stuffy nose   Return in about 1 year (around 04/29/2018).    Thank you for the opportunity to care for this patient.  Please do not hesitate to contact  me with questions.  Tonette BihariJ. A. Bardelas, M.D.  [facility] [address] [place], [place] [phone]

## 2017-04-29 NOTE — Patient Instructions (Signed)
Continue on cetirizine 10 mg once a day  Ask  dentist about TMJ syndrome Call me if you are not doing well on this treatment plan Fluticasone 2 sprays per nostril once a day if needed for stuffy nose

## 2017-05-27 ENCOUNTER — Ambulatory Visit (INDEPENDENT_AMBULATORY_CARE_PROVIDER_SITE_OTHER): Payer: BLUE CROSS/BLUE SHIELD | Admitting: *Deleted

## 2017-05-27 DIAGNOSIS — J4540 Moderate persistent asthma, uncomplicated: Secondary | ICD-10-CM

## 2017-05-27 DIAGNOSIS — L501 Idiopathic urticaria: Secondary | ICD-10-CM | POA: Diagnosis not present

## 2017-06-24 ENCOUNTER — Ambulatory Visit (INDEPENDENT_AMBULATORY_CARE_PROVIDER_SITE_OTHER): Payer: BLUE CROSS/BLUE SHIELD | Admitting: *Deleted

## 2017-06-24 DIAGNOSIS — L500 Allergic urticaria: Secondary | ICD-10-CM

## 2017-07-22 ENCOUNTER — Ambulatory Visit (INDEPENDENT_AMBULATORY_CARE_PROVIDER_SITE_OTHER): Payer: BLUE CROSS/BLUE SHIELD

## 2017-07-22 DIAGNOSIS — L500 Allergic urticaria: Secondary | ICD-10-CM

## 2017-08-19 ENCOUNTER — Telehealth: Payer: Self-pay

## 2017-08-19 ENCOUNTER — Ambulatory Visit (INDEPENDENT_AMBULATORY_CARE_PROVIDER_SITE_OTHER): Payer: BLUE CROSS/BLUE SHIELD

## 2017-08-19 DIAGNOSIS — L500 Allergic urticaria: Secondary | ICD-10-CM | POA: Diagnosis not present

## 2017-08-19 NOTE — Telephone Encounter (Signed)
Pt. Was here receiving her xolair today for hives. Pt. Had some concerns about weight loss. She's been on the xolair for about a year and she was at 220 lbs and now is at 150 lbs. She stated she has been exercising and watching what she eats.  She also stated this is the 2nd occurrence that she has felt a little dizzy and weird after her injections. She didn't tell me she felt different from the last injection until she started feeling the same way. She had only eaten a yogurt this morning and I had offered her snacks. She was [place] to [place] and would eat something when she arrived. Pt. Was also wondering could the dose be too high since she had lost 70 lbs and planned to lose 10 more.  Please advise.

## 2017-08-20 NOTE — Telephone Encounter (Signed)
It appears that she is trying to lose weight. She should see her family doctor regarding her weight loss. She should have breakfast on the days that she is going to get the Xolair injection because I don't want her to be hypoglycemic. She may take Zyrtec 10 mg on the mornings that she is going to receive her Xolair injection later that day

## 2017-08-20 NOTE — Telephone Encounter (Signed)
Spoke with pt informed her of doctors note.

## 2017-09-13 ENCOUNTER — Ambulatory Visit (INDEPENDENT_AMBULATORY_CARE_PROVIDER_SITE_OTHER): Payer: BLUE CROSS/BLUE SHIELD

## 2017-09-13 DIAGNOSIS — L500 Allergic urticaria: Secondary | ICD-10-CM

## 2017-09-16 ENCOUNTER — Ambulatory Visit: Payer: Self-pay

## 2017-10-07 ENCOUNTER — Ambulatory Visit: Payer: Self-pay

## 2017-10-14 ENCOUNTER — Ambulatory Visit (INDEPENDENT_AMBULATORY_CARE_PROVIDER_SITE_OTHER): Payer: BLUE CROSS/BLUE SHIELD

## 2017-10-14 DIAGNOSIS — L500 Allergic urticaria: Secondary | ICD-10-CM

## 2017-11-11 ENCOUNTER — Ambulatory Visit (INDEPENDENT_AMBULATORY_CARE_PROVIDER_SITE_OTHER): Payer: BLUE CROSS/BLUE SHIELD

## 2017-11-11 ENCOUNTER — Telehealth: Payer: Self-pay | Admitting: *Deleted

## 2017-11-11 DIAGNOSIS — L500 Allergic urticaria: Secondary | ICD-10-CM

## 2017-11-11 NOTE — Telephone Encounter (Signed)
Patient would like a detailed receipt from her payment today. She needs it to say the dates of service and that it was specifically for her xolair shots. She would like it emailed to her. Email is on file

## 2017-11-12 NOTE — Telephone Encounter (Signed)
Emailed receipt to email address

## 2017-12-09 ENCOUNTER — Ambulatory Visit (INDEPENDENT_AMBULATORY_CARE_PROVIDER_SITE_OTHER): Payer: BLUE CROSS/BLUE SHIELD

## 2017-12-09 DIAGNOSIS — L500 Allergic urticaria: Secondary | ICD-10-CM

## 2018-01-06 ENCOUNTER — Ambulatory Visit (INDEPENDENT_AMBULATORY_CARE_PROVIDER_SITE_OTHER): Payer: BLUE CROSS/BLUE SHIELD | Admitting: *Deleted

## 2018-01-06 DIAGNOSIS — L500 Allergic urticaria: Secondary | ICD-10-CM | POA: Diagnosis not present

## 2018-02-03 ENCOUNTER — Ambulatory Visit (INDEPENDENT_AMBULATORY_CARE_PROVIDER_SITE_OTHER): Payer: BLUE CROSS/BLUE SHIELD

## 2018-02-03 DIAGNOSIS — J4540 Moderate persistent asthma, uncomplicated: Secondary | ICD-10-CM | POA: Diagnosis not present

## 2018-03-04 ENCOUNTER — Ambulatory Visit (INDEPENDENT_AMBULATORY_CARE_PROVIDER_SITE_OTHER): Payer: BLUE CROSS/BLUE SHIELD

## 2018-03-04 DIAGNOSIS — L500 Allergic urticaria: Secondary | ICD-10-CM

## 2018-03-28 ENCOUNTER — Ambulatory Visit (INDEPENDENT_AMBULATORY_CARE_PROVIDER_SITE_OTHER): Payer: BLUE CROSS/BLUE SHIELD

## 2018-03-28 DIAGNOSIS — L500 Allergic urticaria: Secondary | ICD-10-CM | POA: Diagnosis not present

## 2018-04-25 ENCOUNTER — Ambulatory Visit (INDEPENDENT_AMBULATORY_CARE_PROVIDER_SITE_OTHER): Payer: BLUE CROSS/BLUE SHIELD

## 2018-04-25 DIAGNOSIS — L500 Allergic urticaria: Secondary | ICD-10-CM

## 2018-04-28 ENCOUNTER — Ambulatory Visit: Payer: Self-pay

## 2018-05-05 ENCOUNTER — Ambulatory Visit (INDEPENDENT_AMBULATORY_CARE_PROVIDER_SITE_OTHER): Payer: BLUE CROSS/BLUE SHIELD | Admitting: Pediatrics

## 2018-05-05 ENCOUNTER — Encounter: Payer: Self-pay | Admitting: Pediatrics

## 2018-05-05 VITALS — BP 130/86 | HR 78 | Temp 98.3°F | Resp 20 | Ht 64.0 in | Wt 167.2 lb

## 2018-05-05 DIAGNOSIS — J301 Allergic rhinitis due to pollen: Secondary | ICD-10-CM | POA: Insufficient documentation

## 2018-05-05 DIAGNOSIS — L508 Other urticaria: Secondary | ICD-10-CM

## 2018-05-05 DIAGNOSIS — L503 Dermatographic urticaria: Secondary | ICD-10-CM | POA: Diagnosis not present

## 2018-05-05 NOTE — Patient Instructions (Addendum)
Zyrtec 10 mg at night for itching.  You may use Claritin 10 mg in the morning if needed for itching Fluticasone 2 sprays per nostril once a day if needed for stuffy nose See your family doctor regarding your anxiety Continue Xolair 300 mg every 28 days Call us if you are not doing well on this treatment plan

## 2018-05-05 NOTE — Progress Notes (Signed)
  [address] [place] [address] Dept: [phone]  FOLLOW UP NOTE  Patient ID: Lori Williamson, female    DOB: 25-Dec-1980  Age: 37 y.o. MRN: [id number] Date of Office Visit: 05/05/2018  Assessment  Chief Complaint: Urticaria (improved.  c/o anxiety)  HPI Lori Williamson presents for follow-up of chronic urticaria.  She has had an excellent response Xolair 300 mg every 28 days and uses Zyrtec 10 mg at night.  Sometimes she has some itching when she is under stress..  She had severe allergic rhinitis in the springtime and  used Afrin nasal spray for about 3 months.  In 2017 , at another office she was allergic to tree pollens, grass pollens, mold , weed pollen, dust mite, mouse , horse , and cockroach   Drug Allergies:  Allergies  Allergen Reactions  . Sulfa Antibiotics Nausea And Vomiting    Physical Exam: BP 130/86 (BP Location: Right Arm, Patient Position: Sitting, Cuff Size: Normal)   Pulse 78   Temp 98.3 F (36.8 C) (Oral)   Resp 20   Ht 5\' 4"  (1.626 m)   Wt 167 lb 3.2 oz (75.8 kg)   SpO2 97%   BMI 28.70 kg/m    Physical Exam  Constitutional: She is oriented to person, place, and time. She appears well-developed and well-nourished.  HENT:  Eyes normal  Ears normal.  Nose normal.  Pharynx normal.  Neck: Neck supple. No thyromegaly present.  Cardiovascular:  S1-S2 normal no murmurs  Pulmonary/Chest:  Clear to percussion and auscultation  Lymphadenopathy:    She has no cervical adenopathy.  Neurological: She is alert and oriented to person, place, and time.  Skin:  Clear but she had mild dermographia noted  Vitals reviewed.   Diagnostics:    Assessment and Plan: 1. Chronic urticaria   2. Dermographia   3. Seasonal allergic rhinitis due to pollen        Patient Instructions  Zyrtec 10 mg at night for itching.  You may use Claritin 10 mg in the morning if needed for itching Fluticasone 2 sprays per nostril once a day if needed for  stuffy nose See your family doctor regarding your anxiety Continue Xolair 300 mg every 28 days Call us if you are not doing well on this treatment plan   Return in about 1 year (around 05/06/2019).    Thank you for the opportunity to care for this patient.  Please do not hesitate to contact me with questions.  Tonette BihariJ. A. Bardelas, M.D.  [facility] [address] [place], [place] [address] [phone]

## 2018-05-21 ENCOUNTER — Ambulatory Visit: Payer: BLUE CROSS/BLUE SHIELD | Admitting: Family Medicine

## 2018-05-21 ENCOUNTER — Encounter: Payer: Self-pay | Admitting: Family Medicine

## 2018-05-21 VITALS — BP 120/82 | HR 84 | Temp 98.4°F | Ht 64.0 in | Wt 166.0 lb

## 2018-05-21 DIAGNOSIS — F411 Generalized anxiety disorder: Secondary | ICD-10-CM

## 2018-05-21 NOTE — Patient Instructions (Addendum)
Please consider counseling. Contact [phone] to schedule an appointment or inquire about cost/insurance coverage.  Coping skills Choose 5 that work for you:  Take a deep breath  Count to 20  Read a book  Do a puzzle  Meditate  Bake  Sing  Knit  Garden  Pray  Go outside  Call a friend  Listen to music  Take a walk  Color  Send a note  Take a bath  Watch a movie  Be alone in a quiet place  Pet an animal  Visit a friend  Journal  Exercise  Stretch   Aim to do some physical exertion for 150 minutes per week. This is typically divided into 5 days per week, 30 minutes per day. The activity should be enough to get your heart rate up. Anything is better than nothing if you have time constraints.  Let us know if you need anything. 

## 2018-05-21 NOTE — Progress Notes (Signed)
Pre visit review using our clinic review tool, if applicable. No additional management support is needed unless otherwise documented below in the visit note. 

## 2018-05-21 NOTE — Progress Notes (Signed)
Chief Complaint  Patient presents with  . New Patient (Initial Visit)       New Patient Visit SUBJECTIVE: HPI: Lori Williamson is an 37 y.o.female who is being seen for establishing care.  The patient was previously seen at a different office.  The patient has a long-standing history of anxiety and situational IBS.  This dates back to her early adolescent years.  She does not have a family history and does not report being on any treatment for this in the past.  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  She does interact with many physicians.  Prior to entering their offices, she will be anxious and doomsday scenarios will run through her mind.  Work is definitely a stressor for her.  She also has a history of dermographism.  She feels that her stress/anxiety is contributing to this condition as well.  She is not following with a counselor or psychologist.  Allergies  Allergen Reactions  . Sulfa Antibiotics Nausea And Vomiting    Past Medical History:  Diagnosis Date  . Allergic rhinitis   . Hypercholesterolemia   . Urticaria    Past Surgical History:  Procedure Laterality Date  . CHOLECYSTECTOMY  2009   Family History  Problem Relation Age of Onset  . Diabetes Mother   . Hypertension Mother   . Hypercholesterolemia Mother   . Allergic rhinitis Father   . Allergic rhinitis Brother   . Hypercholesterolemia Brother   . Angioedema Neg Hx   . Asthma Neg Hx   . Eczema Neg Hx   . Immunodeficiency Neg Hx   . Urticaria Neg Hx    Allergies  Allergen Reactions  . Sulfa Antibiotics Nausea And Vomiting    Current Outpatient Medications:  .  cetirizine (ZYRTEC) 10 MG tablet, Take 10 mg by mouth daily. Every evening, Disp: , Rfl:  .  diphenhydrAMINE (BENADRYL) 25 mg capsule, Take 25 mg by mouth as needed. For breakthru, Disp: , Rfl:  .  EPINEPHrine 0.3 mg/0.3 mL IJ SOAJ injection, , Disp: , Rfl:  .   fluticasone (FLONASE) 50 MCG/ACT nasal spray, Place 1 spray into both nostrils daily as needed for allergies (may use two sprays each nostril once a day as needed.). , Disp: , Rfl:  .  loratadine (CLARITIN) 10 MG tablet, Take 10 mg by mouth daily. Every morning, Disp: , Rfl:  .  montelukast (SINGULAIR) 10 MG tablet, Take 1 tablet (10 mg total) by mouth at bedtime., Disp: 34 tablet, Rfl: 5 .  Probiotic Product (PROBIOTIC ADVANCED PO), Take by mouth 3 x daily with food., Disp: , Rfl:  .  vitamin C (ASCORBIC ACID) 500 MG tablet, Take 500 mg by mouth daily. Takes 2 when sick, Disp: , Rfl:  .  XOLAIR 150 MG injection, Inject 150 mg into the skin every 28 (twenty-eight) days., Disp: , Rfl:   ROS Cardiovascular: Denies chest pain  Respiratory: Denies dyspnea   OBJECTIVE: BP 120/82 (BP Location: Left Arm, Patient Position: Sitting, Cuff Size: Normal)   Pulse 84   Temp 98.4 F (36.9 C) (Oral)   Ht 5\' 4"  (1.626 m)   Wt 166 lb (75.3 kg)   SpO2 97%   BMI 28.49 kg/m   Constitutional: -  VS reviewed -  Well developed, well nourished, appears stated age -  No apparent distress  Psychiatric: -  Oriented to person, place, and time -  Memory intact -  Affect and mood normal -  Fluent conversation, good eye contact -  Judgment and insight age appropriate  Eye: -  Conjunctivae clear, no discharge -  Pupils symmetric, round, reactive to light  ENMT: -  MMM    Pharynx moist, no exudate, no erythema  Neck: -  No gross swelling, no palpable masses -  Thyroid midline, not enlarged, mobile, no palpable masses  Cardiovascular: -  RRR -  No LE edema  Respiratory: -  Normal respiratory effort, no accessory muscle use, no retraction -  Breath sounds equal, no wheezes, no ronchi, no crackles  Neurological:  -  CN II - XII grossly intact -  DTRs equal and symmetric throughout, no clonus, no cerebellar signs -  Sensation grossly intact to light touch, equal bilaterally  Musculoskeletal: -  No clubbing, no  cyanosis -  Gait normal  Skin: -  No significant lesion on inspection -  Warm and dry to palpation   ASSESSMENT/PLAN: GAD (generalized anxiety disorder)  Patient instructed to sign release of records form from her previous PCP. Counseled on treatment options for above.  She would like to investigate counseling more.  Her vocation provides free counseling visits up to a certain number.  I [facility].  Anxiety coping mechanisms were also provided in her AVS.  Counseled on exercise being a supplement for managing this.  I did encourage she research medications used for this.  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 Patient should return in 3 months, sooner if needed. The patient voiced understanding and agreement to the plan.  Greater than 30 minutes were spent face to face with the patient with greater than 50% of this time spent counseling on diagnosis, prognosis, treatment options, exercise, and follow-up.   Jilda Rocheicholas [place], DO 05/21/18  3:28 PM

## 2018-05-26 ENCOUNTER — Ambulatory Visit (INDEPENDENT_AMBULATORY_CARE_PROVIDER_SITE_OTHER): Payer: BLUE CROSS/BLUE SHIELD | Admitting: *Deleted

## 2018-05-26 DIAGNOSIS — L500 Allergic urticaria: Secondary | ICD-10-CM

## 2018-06-16 ENCOUNTER — Ambulatory Visit: Payer: Self-pay

## 2018-06-23 ENCOUNTER — Ambulatory Visit (INDEPENDENT_AMBULATORY_CARE_PROVIDER_SITE_OTHER): Payer: BLUE CROSS/BLUE SHIELD

## 2018-06-23 DIAGNOSIS — L500 Allergic urticaria: Secondary | ICD-10-CM | POA: Diagnosis not present

## 2018-07-21 ENCOUNTER — Ambulatory Visit (INDEPENDENT_AMBULATORY_CARE_PROVIDER_SITE_OTHER): Payer: BLUE CROSS/BLUE SHIELD

## 2018-07-21 DIAGNOSIS — L500 Allergic urticaria: Secondary | ICD-10-CM | POA: Diagnosis not present

## 2018-08-18 ENCOUNTER — Encounter: Payer: Self-pay | Admitting: Family Medicine

## 2018-08-18 ENCOUNTER — Ambulatory Visit (INDEPENDENT_AMBULATORY_CARE_PROVIDER_SITE_OTHER): Payer: BLUE CROSS/BLUE SHIELD | Admitting: Family Medicine

## 2018-08-18 ENCOUNTER — Ambulatory Visit (INDEPENDENT_AMBULATORY_CARE_PROVIDER_SITE_OTHER): Payer: BLUE CROSS/BLUE SHIELD

## 2018-08-18 VITALS — BP 102/76 | HR 73 | Temp 98.3°F | Ht 64.0 in | Wt 171.5 lb

## 2018-08-18 DIAGNOSIS — F411 Generalized anxiety disorder: Secondary | ICD-10-CM | POA: Diagnosis not present

## 2018-08-18 DIAGNOSIS — M25462 Effusion, left knee: Secondary | ICD-10-CM

## 2018-08-18 DIAGNOSIS — M25461 Effusion, right knee: Secondary | ICD-10-CM | POA: Diagnosis not present

## 2018-08-18 DIAGNOSIS — L500 Allergic urticaria: Secondary | ICD-10-CM | POA: Diagnosis not present

## 2018-08-18 NOTE — Progress Notes (Signed)
Pre visit review using our clinic review tool, if applicable. No additional management support is needed unless otherwise documented below in the visit note. 

## 2018-08-18 NOTE — Progress Notes (Signed)
Chief Complaint  Patient presents with  . Follow-up    Subjective Lori Williamson presents for f/u anxiety/depression.  Patient was seen 3 months ago for anxiety.  We discussed the options and decided on counseling.  She seen a counselor several times through [facility].  She feels things have gotten approximately 50-60% better, but cannot precisely identify why.  She does note that when she took a vacation, she started to sleep better and had no issues with anxiety.  All the symptoms returned when she went back to work.  She does have a heavy load at work and a lot is required of her.  She did have swelling in both of her knees, worse on the right, several weeks ago after a day of kneeling and moving a lot.  They are not swollen or painful right now.  ROS Psych: No homicidal or suicidal thoughts  Past Medical History:  Diagnosis Date  . Allergic rhinitis   . Hypercholesterolemia   . Urticaria      Exam BP 102/76 (BP Location: Left Arm, Patient Position: Sitting, Cuff Size: Normal)   Pulse 73   Temp 98.3 F (36.8 C) (Oral)   Ht 5\' 4"  (1.626 m)   Wt 171 lb 8 oz (77.8 kg)   SpO2 96%   BMI 29.44 kg/m  General:  well developed, well nourished, in no apparent distress Lungs: No accessory muscle use MSK: Bilateral knees- normal range of motion, no effusion, no tenderness to palpation, negative Lachman's, posterior drawer, varus/valgus stress, Stines bilaterally Psych: well oriented with normal range of affect and age-appropriate judgement/insight, alert and oriented x4.  Assessment and Plan  GAD (generalized anxiety disorder)  Bilateral knee swelling  Counseled on exercise to help with anxiety.  Continue with counseling.  She is not ready for medication at this time.  We did discuss looking into different vocations and she does have a 3-year exit plan. I think if she does a lot of work on her knees in the future, she should have some sort of padding as this was the  likely trigger for her swelling.  Her exam is unremarkable today. She needs to follow-up with her GYN team for health maintenance. F/u in 6 months for a physical or as needed. The patient voiced understanding and agreement to the plan.  Jilda Roche [place], DO 08/18/18 9:53 AM

## 2018-08-18 NOTE — Patient Instructions (Addendum)
Call Center for [facility] at [facility] at [phone] for an appointment.  They are located at [address], [place], [place], [address] (right across the hall from our office).  Stay active. Start slowly and build yourself up from an activity standpoint.  Check with your other health team members about trying to get pregnant on the Xolair.   Let us know if you need anything.

## 2018-09-15 ENCOUNTER — Ambulatory Visit (INDEPENDENT_AMBULATORY_CARE_PROVIDER_SITE_OTHER): Payer: BLUE CROSS/BLUE SHIELD

## 2018-09-15 DIAGNOSIS — L501 Idiopathic urticaria: Secondary | ICD-10-CM

## 2018-09-15 DIAGNOSIS — L508 Other urticaria: Secondary | ICD-10-CM

## 2018-10-13 ENCOUNTER — Ambulatory Visit (INDEPENDENT_AMBULATORY_CARE_PROVIDER_SITE_OTHER): Payer: BLUE CROSS/BLUE SHIELD

## 2018-10-13 DIAGNOSIS — L508 Other urticaria: Secondary | ICD-10-CM

## 2018-10-13 DIAGNOSIS — L501 Idiopathic urticaria: Secondary | ICD-10-CM | POA: Diagnosis not present

## 2018-11-10 ENCOUNTER — Ambulatory Visit (INDEPENDENT_AMBULATORY_CARE_PROVIDER_SITE_OTHER): Payer: BLUE CROSS/BLUE SHIELD

## 2018-11-10 DIAGNOSIS — L501 Idiopathic urticaria: Secondary | ICD-10-CM

## 2018-11-10 DIAGNOSIS — L508 Other urticaria: Secondary | ICD-10-CM

## 2018-12-08 ENCOUNTER — Ambulatory Visit (INDEPENDENT_AMBULATORY_CARE_PROVIDER_SITE_OTHER): Payer: BLUE CROSS/BLUE SHIELD | Admitting: *Deleted

## 2018-12-08 DIAGNOSIS — L501 Idiopathic urticaria: Secondary | ICD-10-CM

## 2018-12-08 DIAGNOSIS — L508 Other urticaria: Secondary | ICD-10-CM

## 2019-01-05 ENCOUNTER — Ambulatory Visit: Payer: Self-pay

## 2019-01-05 ENCOUNTER — Ambulatory Visit (INDEPENDENT_AMBULATORY_CARE_PROVIDER_SITE_OTHER): Payer: BLUE CROSS/BLUE SHIELD

## 2019-01-05 ENCOUNTER — Other Ambulatory Visit: Payer: Self-pay

## 2019-01-05 DIAGNOSIS — L501 Idiopathic urticaria: Secondary | ICD-10-CM | POA: Diagnosis not present

## 2019-01-05 DIAGNOSIS — L508 Other urticaria: Secondary | ICD-10-CM

## 2019-02-02 ENCOUNTER — Ambulatory Visit (INDEPENDENT_AMBULATORY_CARE_PROVIDER_SITE_OTHER): Payer: BLUE CROSS/BLUE SHIELD

## 2019-02-02 ENCOUNTER — Other Ambulatory Visit: Payer: Self-pay

## 2019-02-02 DIAGNOSIS — L508 Other urticaria: Secondary | ICD-10-CM

## 2019-02-02 DIAGNOSIS — L501 Idiopathic urticaria: Secondary | ICD-10-CM

## 2019-02-23 ENCOUNTER — Encounter: Payer: BLUE CROSS/BLUE SHIELD | Admitting: Family Medicine

## 2019-03-09 ENCOUNTER — Ambulatory Visit (INDEPENDENT_AMBULATORY_CARE_PROVIDER_SITE_OTHER): Payer: BLUE CROSS/BLUE SHIELD

## 2019-03-09 ENCOUNTER — Other Ambulatory Visit: Payer: Self-pay

## 2019-03-09 DIAGNOSIS — L501 Idiopathic urticaria: Secondary | ICD-10-CM

## 2019-03-09 DIAGNOSIS — L508 Other urticaria: Secondary | ICD-10-CM

## 2019-04-06 ENCOUNTER — Ambulatory Visit (INDEPENDENT_AMBULATORY_CARE_PROVIDER_SITE_OTHER): Payer: BC Managed Care – PPO

## 2019-04-06 ENCOUNTER — Other Ambulatory Visit: Payer: Self-pay

## 2019-04-06 DIAGNOSIS — L508 Other urticaria: Secondary | ICD-10-CM

## 2019-04-06 DIAGNOSIS — L501 Idiopathic urticaria: Secondary | ICD-10-CM

## 2019-05-04 ENCOUNTER — Ambulatory Visit (INDEPENDENT_AMBULATORY_CARE_PROVIDER_SITE_OTHER): Payer: BC Managed Care – PPO

## 2019-05-04 ENCOUNTER — Other Ambulatory Visit: Payer: Self-pay

## 2019-05-04 DIAGNOSIS — L501 Idiopathic urticaria: Secondary | ICD-10-CM

## 2019-05-11 ENCOUNTER — Encounter: Payer: Self-pay | Admitting: Pediatrics

## 2019-05-11 ENCOUNTER — Encounter: Payer: Self-pay | Admitting: Family Medicine

## 2019-05-11 ENCOUNTER — Telehealth: Payer: Self-pay | Admitting: *Deleted

## 2019-05-11 ENCOUNTER — Ambulatory Visit (INDEPENDENT_AMBULATORY_CARE_PROVIDER_SITE_OTHER): Payer: BC Managed Care – PPO | Admitting: Pediatrics

## 2019-05-11 ENCOUNTER — Other Ambulatory Visit: Payer: Self-pay

## 2019-05-11 ENCOUNTER — Ambulatory Visit (INDEPENDENT_AMBULATORY_CARE_PROVIDER_SITE_OTHER): Payer: BC Managed Care – PPO | Admitting: Family Medicine

## 2019-05-11 VITALS — BP 112/72 | HR 76 | Temp 98.2°F | Resp 16 | Ht 64.5 in | Wt 184.2 lb

## 2019-05-11 VITALS — BP 104/68 | HR 87 | Temp 98.9°F | Ht 64.0 in | Wt 184.4 lb

## 2019-05-11 DIAGNOSIS — L503 Dermatographic urticaria: Secondary | ICD-10-CM

## 2019-05-11 DIAGNOSIS — Z0000 Encounter for general adult medical examination without abnormal findings: Secondary | ICD-10-CM

## 2019-05-11 DIAGNOSIS — J301 Allergic rhinitis due to pollen: Secondary | ICD-10-CM | POA: Diagnosis not present

## 2019-05-11 DIAGNOSIS — L508 Other urticaria: Secondary | ICD-10-CM | POA: Diagnosis not present

## 2019-05-11 LAB — LIPID PANEL
Cholesterol: 231 mg/dL — ABNORMAL HIGH (ref 0–200)
HDL: 53.8 mg/dL (ref >39.00)
LDL Cholesterol: 160 mg/dL — ABNORMAL HIGH (ref 0–99)
NonHDL: 177.18
Total CHOL/HDL Ratio: 4
Triglycerides: 84 mg/dL (ref 0.0–149.0)
VLDL: 16.8 mg/dL (ref 0.0–40.0)

## 2019-05-11 LAB — CBC
HCT: 41.5 % (ref 36.0–46.0)
Hemoglobin: 14 g/dL (ref 12.0–15.0)
MCHC: 33.8 g/dL (ref 30.0–36.0)
MCV: 93.4 fl (ref 78.0–100.0)
Platelets: 276 10*3/uL (ref 150.0–400.0)
RBC: 4.45 Mil/uL (ref 3.87–5.11)
RDW: 12.3 % (ref 11.5–15.5)
WBC: 8.4 10*3/uL (ref 4.0–10.5)

## 2019-05-11 LAB — COMPREHENSIVE METABOLIC PANEL
ALT: 9 U/L (ref 0–35)
AST: 11 U/L (ref 0–37)
Albumin: 4.5 g/dL (ref 3.5–5.2)
Alkaline Phosphatase: 43 U/L (ref 39–117)
BUN: 13 mg/dL (ref 6–23)
CO2: 25 mEq/L (ref 19–32)
Calcium: 9.5 mg/dL (ref 8.4–10.5)
Chloride: 105 mEq/L (ref 96–112)
Creatinine, Ser: 0.79 mg/dL (ref 0.40–1.20)
GFR: 81.69 mL/min (ref >60.00)
Glucose, Bld: 91 mg/dL (ref 70–99)
Potassium: 4.1 mEq/L (ref 3.5–5.1)
Sodium: 139 mEq/L (ref 135–145)
Total Bilirubin: 0.6 mg/dL (ref 0.2–1.2)
Total Protein: 6.7 g/dL (ref 6.0–8.3)

## 2019-05-11 MED ORDER — EPINEPHRINE 0.3 MG/0.3ML IJ SOAJ: 0.3000 mg | INTRAMUSCULAR | 1 refills | Status: DC | PRN

## 2019-05-11 NOTE — Telephone Encounter (Signed)
Received fax from [facility] that Epipen 0.3 was not covered. I called pharmacy and it is covered if run as [employer] generic but they are unable to order that at this time so pharmacist said she would call the patient and offer to forward the script to another pharmacy if she would like.

## 2019-05-11 NOTE — Progress Notes (Signed)
CC: Physical   Well Woman Lori Williamson is here for a complete physical.   Her last physical was >1 year ago.  Current diet: in general, a "healthy" diet. Current exercise: gardening Has gained wt. No LMP recorded. Seatbelt? Yes  Health Maintenance Pap/HPV- No Tetanus- Yes 07/09/2011 HIV screening- Yes 03/09/2011  Past Medical History:  Diagnosis Date  . Allergic rhinitis   . Hypercholesterolemia   . Urticaria      Past Surgical History:  Procedure Laterality Date  . CHOLECYSTECTOMY  2009    Medications  Current Outpatient Medications on File Prior to Visit  Medication Sig Dispense Refill  . cetirizine (ZYRTEC) 10 MG tablet Take 10 mg by mouth daily. Every evening    . diphenhydrAMINE (BENADRYL) 25 mg capsule Take 25 mg by mouth as needed. For breakthru    . EPINEPHrine 0.3 mg/0.3 mL IJ SOAJ injection     . fluticasone (FLONASE) 50 MCG/ACT nasal spray Place 1 spray into both nostrils daily as needed for allergies (may use two sprays each nostril once a day as needed.).     Marland Kitchen loratadine (CLARITIN) 10 MG tablet Take 10 mg by mouth daily. Every morning    . montelukast (SINGULAIR) 10 MG tablet Take 1 tablet (10 mg total) by mouth at bedtime. 34 tablet 5  . Probiotic Product (PROBIOTIC ADVANCED PO) Take by mouth 3 x daily with food.    . vitamin C (ASCORBIC ACID) 500 MG tablet Take 500 mg by mouth daily. Takes 2 when sick    . XOLAIR 150 MG injection Inject 150 mg into the skin every 28 (twenty-eight) days.     Allergies Allergies  Allergen Reactions  . Sulfa Antibiotics Nausea And Vomiting   Review of Systems: Constitutional:  no unexpected weight changes Eye:  no recent significant change in vision Ear/Nose/Mouth/Throat:  Ears:  no tinnitus or vertigo and no recent change in hearing Nose/Mouth/Throat:  no complaints of nasal congestion, no sore throat Cardiovascular: no chest pain Respiratory:  no cough and no shortness of breath Gastrointestinal:  no  abdominal pain, no change in bowel habits GU:  Female: negative for dysuria or pelvic pain Musculoskeletal/Extremities: R knee pain/swelling/catching; otherwise no pain of the joints Integumentary (Skin/Breast):  no abnormal skin lesions reported Neurologic:  no headaches Endocrine:  denies fatigue Hematologic/Lymphatic:  No areas of easy bleeding  Exam BP 104/68 (BP Location: Left Arm, Patient Position: Sitting, Cuff Size: Normal)   Pulse 87   Temp 98.9 F (37.2 C) (Oral)   Ht 5\' 4"  (1.626 m)   Wt 184 lb 6 oz (83.6 kg)   SpO2 98%   BMI 31.65 kg/m  General:  well developed, well nourished, in no apparent distress Skin:  no significant moles, warts, or growths Head:  no masses, lesions, or tenderness Eyes:  pupils equal and round, sclera anicteric without injection Ears:  canals without lesions, TMs shiny without retraction, no obvious effusion, no erythema Nose:  nares patent, septum midline, mucosa normal, and no drainage or sinus tenderness Throat/Pharynx:  lips and gingiva without lesion; tongue and uvula midline; non-inflamed pharynx; no exudates or postnasal drainage Neck: neck supple without adenopathy, thyromegaly, or masses Lungs:  clear to auscultation, breath sounds equal bilaterally, no respiratory distress Cardio:  regular rate and rhythm, no bruits, no LE edema Abdomen:  abdomen soft, nontender; bowel sounds normal; no masses or organomegaly Genital: Defer to GYN Musculoskeletal:  symmetrical muscle groups noted without atrophy or deformity Extremities:  no clubbing, cyanosis, or  edema, no deformities, no skin discoloration Neuro:  gait normal; deep tendon reflexes normal and symmetric Psych: well oriented with normal range of affect and appropriate judgment/insight  Assessment and Plan  Well adult exam - Plan: CBC, Comprehensive metabolic panel, Lipid panel, GYN info given.   Well 38 y.o. female. Counseled on diet and exercise. Knee stretches/exercises  given. Other orders as above. Follow up in 1 yr or prn. The patient voiced understanding and agreement to the plan.  Lori Rocheicholas Paul [place], DO 05/11/19 10:54 AM

## 2019-05-11 NOTE — Patient Instructions (Addendum)
Zyrtec 10 mg-take 1 tablet once or twice a day if needed for itching Fluticasone 2 sprays per nostril once a day if needed for stuffy nose Continue Xolair 300 mg every 28 days Call us if you are not doing well on this treatment plan Continue on your other medications

## 2019-05-11 NOTE — Patient Instructions (Addendum)
[facility] [place] at [phone] for an appointment.  They are located at [address], [address], [place], [place], [phone] (right across the hall from our office).  Give us 2-3 business days to get the results of your labs back.   Keep the diet clean and stay active.  Stretching and range of motion exercises These exercises warm up your muscles and joints and improve the movement and flexibility of your knee. These exercises also help to relieve pain and stiffness.  Exercise A: Knee flexion, active 1. Lie on your back with both knees straight. If this causes back discomfort, bend your uninjured knee so your foot is flat on the floor. 2. Slowly slide your left / right heel back toward your buttocks until you feel a gentle stretch in the front of your knee or thigh. Stop if you have pain. 3. Hold for3 seconds. 4. Slowly slide your left / right heel back to the starting position. 10 total repetitions. Repeat 2 times. Complete this exercise 3 times a week.  Exercise B: Knee extension, sitting 1. Sit with your left / right heel propped on a chair, a coffee table, or a footstool. Do not have anything under your knee to support it. 2. Allow your leg muscles to relax, letting gravity straighten out your knee. You should feel a stretch behind your left / right knee. 3. If told by your health care provide just above your kneecap. 4. Hold this position for 3 seconds. 5. Repeat for a total of 10 repetitions. Repeat 2 times. Complete this stretch 3 times a week.  Strengthening exercises These exercises build strength and endurance in your knee. Endurance is the ability to use your muscles for a long time, even after they get tired.  Exercise C: Quadriceps, isometric 1. Lie on your back with your left / right leg extended and your other knee bent. Put a rolled towel or small pillow under your right/left knee if told by your health care provider. 2. Slowly  tense the muscles in the front of your left / right thigh by pushing the back of your knee down. You should see your knee cap slide up toward your hip or see increased dimpling just above the knee. 3. For 3 seconds, keep the muscle as tight as you can without increasing your pain. 4. Relax the muscles slowly and completely. Repeat for 10 total repetitions. Repeat 2 times. Complete this exercise 3 times a week. Exercise D: Straight leg raises (quadriceps) 1. Lie on your back with your left / right leg extended and your other knee bent. 2. Tense the muscles in the front of your left / right thigh. You should see your kneecap slide up or see increased dimpling just above the knee. 3. Keep these muscles tight as you raise your leg 4-6 inches (10-15 cm) off the floor. 4. Hold this position for 3 seconds. 5. Keep these muscles tense as you lower your leg. 6. Relax the muscles slowly and completely. Repeat for a total of 10 repetitions. Repeat 2 times. Complete this exercise 3 times a week.  Exercise E: Hamstring curls 1. On the floor or a bed, lie on your abdomen with your legs straight. Put a folded towel or small pillow under your left / right thigh, just above your kneecap. 2. Slowly bend your left / right knee as far as you can without pain. Keep your hips flat against the floor or bed. 3.  Hold this position for 3 seconds. 4. Slowly lower your leg to the starting position. Repeat for a total of 10 repetitions. Repeat 2 times. Complete this exercise 3 times per week.  Stretching exercises These exercises warm up your muscles and joints and improve the movement and flexibility of your knee. These exercises also help to relieve pain and stiffness.  Exercise A: Quadriceps, prone 1. Lie on your abdomen on a firm surface, such as a bed or padded floor. 2. Bend your left / right knee and hold your ankle. If you cannot reach your ankle or pant leg, loop a belt around your foot and grab the belt  instead. 3. Gently pull your heel toward your buttocks. Your knee should not slide out to the side. You should feel a stretch in the front of your thigh and knee. 4. Hold this position for 30 seconds. Repeat 2 times. Complete this stretch 3 times a week.  Exercise B: Hamstring, doorway 1. Lie on your back in front of a doorway with your left / right leg resting against the wall and your other leg flat on the floor in the doorway. There should be a slight bend in your left / right knee. 2. Straighten your left / right knee. You should feel a stretch behind your knee or thigh. If you do not feel that stretch, scoot your buttocks closer to the door. 3. Hold this position for 30 seconds. Repeat 2 times. Complete this stretch 3 times a week.  Strengthening exercises These exercises build strength and endurance in your knee and leg muscles. Endurance is the ability to use your muscles for a long time, even after they get tired.   Exercise D: Wall slides (quadriceps) 1. Lean your back against a smooth wall or door, and walk your feet out 18-24 inches (45-61 cm) from it. 2. Place your feet hip-width apart. 3. Slowly slide down the wall or door until your knees bend 90 degrees. Keep your knees over your heels, not over your toes. Keep your knees in line with your hips. 4. Hold for 2 seconds. 5. Stand up to rest for 60 seconds. Repeat 2 times. Complete this exercise 3 times a week.  Exercise E: Bridge (hip extensors) 1. Lie on your back on a firm surface with your knees bent and your feet flat on the floor. 2. Tighten your buttocks muscles and lift your bottom off the floor until your trunk is level with your thighs. ? Do not arch your back. ? You should feel the muscles working in your buttocks and the back of your thighs. 3. Hold this position for 2 seconds. 4. Slowly lower your hips to the starting position. 5. Let your buttocks muscles relax completely between repetitions. Repeat 2 times.  Complete this exercise 3 times a week.

## 2019-05-11 NOTE — Progress Notes (Signed)
  [address] [place] [address] Dept: [phone]  FOLLOW UP NOTE  Patient ID: Lori Williamson, female    DOB: [birth year]  Age: 38 y.o. MRN: [id number] Date of Office Visit: 05/11/2019  Assessment  Chief Complaint: Urticaria  HPI [name] presents for follow-up of chronic urticaria.  She is on Xolair 300 mg every 28 days and this is helping her urticaria greatly.  She is on Zyrtec 10 mg once a day and sometimes she has breakthrough itching and adds Benadryl.  She has not had to use montelukast.  She had significant allergic rhinitis in the springtime and used  fluticasone.   Drug Allergies:  Allergies  Allergen Reactions  . Sulfa Antibiotics Nausea And Vomiting    Physical Exam: BP 112/72 (BP Location: Left Arm, Patient Position: Sitting, Cuff Size: Normal)   Pulse 76   Temp 98.2 F (36.8 C) (Temporal)   Resp 16   Ht 5' 4.5" (1.638 m)   Wt 184 lb 3.2 oz (83.6 kg)   SpO2 99%   BMI 31.13 kg/m    Physical Exam Constitutional:      Appearance: Normal appearance. She is obese.  HENT:     Head:     Comments: Eyes normal.  Ears normal.  Nose normal.  Pharynx normal. Neck:     Musculoskeletal: Neck supple.  Cardiovascular:     Comments: S1-S2 normal no murmurs Pulmonary:     Comments: Clear to percussion and auscultation Lymphadenopathy:     Cervical: No cervical adenopathy.  Skin:    Comments: Clear  Neurological:     General: No focal deficit present.     Mental Status: She is alert and oriented to person, place, and time. Mental status is at baseline.  Psychiatric:        Mood and Affect: Mood normal.        Behavior: Behavior normal.        Thought Content: Thought content normal.        Judgment: Judgment normal.     Diagnostics:    Assessment and Plan: 1. Chronic urticaria   2. Dermographia   3. Seasonal allergic rhinitis due to pollen     Meds ordered this encounter  Medications  . EPINEPHrine 0.3 mg/0.3 mL IJ SOAJ  injection    Sig: Inject 0.3 mLs (0.3 mg total) into the muscle as needed for anaphylaxis.    Dispense:  1 each    Refill:  1    Patient Instructions  Zyrtec 10 mg-take 1 tablet once or twice a day if needed for itching Fluticasone 2 sprays per nostril once a day if needed for stuffy nose Continue Xolair 300 mg every 28 days Call us if you are not doing well on this treatment plan Continue on your other medications   Return in about 1 year (around 05/10/2020).    Thank you for the opportunity to care for this patient.  Please do not hesitate to contact me with questions.  Penne Lash, M.D.  [facility] [address] [place], [place] [address] [phone]

## 2019-05-12 ENCOUNTER — Other Ambulatory Visit: Payer: Self-pay | Admitting: Family Medicine

## 2019-05-12 DIAGNOSIS — E785 Hyperlipidemia, unspecified: Secondary | ICD-10-CM

## 2019-06-01 ENCOUNTER — Ambulatory Visit: Payer: Self-pay

## 2019-06-02 ENCOUNTER — Ambulatory Visit: Payer: Self-pay

## 2019-06-12 ENCOUNTER — Other Ambulatory Visit: Payer: Self-pay

## 2019-06-12 ENCOUNTER — Ambulatory Visit (INDEPENDENT_AMBULATORY_CARE_PROVIDER_SITE_OTHER): Payer: BC Managed Care – PPO

## 2019-06-12 DIAGNOSIS — L501 Idiopathic urticaria: Secondary | ICD-10-CM | POA: Diagnosis not present

## 2019-06-22 ENCOUNTER — Other Ambulatory Visit: Payer: Self-pay

## 2019-06-22 ENCOUNTER — Other Ambulatory Visit (INDEPENDENT_AMBULATORY_CARE_PROVIDER_SITE_OTHER): Payer: BC Managed Care – PPO

## 2019-06-22 DIAGNOSIS — E785 Hyperlipidemia, unspecified: Secondary | ICD-10-CM

## 2019-06-22 LAB — LIPID PANEL
Cholesterol: 237 mg/dL — ABNORMAL HIGH (ref 0–200)
HDL: 54.5 mg/dL (ref >39.00)
LDL Cholesterol: 167 mg/dL — ABNORMAL HIGH (ref 0–99)
NonHDL: 182.44
Total CHOL/HDL Ratio: 4
Triglycerides: 77 mg/dL (ref 0.0–149.0)
VLDL: 15.4 mg/dL (ref 0.0–40.0)

## 2019-07-13 ENCOUNTER — Other Ambulatory Visit: Payer: Self-pay

## 2019-07-13 ENCOUNTER — Ambulatory Visit (INDEPENDENT_AMBULATORY_CARE_PROVIDER_SITE_OTHER): Payer: BC Managed Care – PPO

## 2019-07-13 DIAGNOSIS — L501 Idiopathic urticaria: Secondary | ICD-10-CM | POA: Diagnosis not present

## 2019-08-10 ENCOUNTER — Other Ambulatory Visit: Payer: Self-pay

## 2019-08-10 ENCOUNTER — Ambulatory Visit (INDEPENDENT_AMBULATORY_CARE_PROVIDER_SITE_OTHER): Payer: BC Managed Care – PPO

## 2019-08-10 ENCOUNTER — Telehealth: Payer: Self-pay

## 2019-08-10 DIAGNOSIS — L501 Idiopathic urticaria: Secondary | ICD-10-CM | POA: Diagnosis not present

## 2019-08-10 NOTE — Telephone Encounter (Signed)
Okay to space out the Xolair to every 6 weeks to see if she does as well

## 2019-08-10 NOTE — Telephone Encounter (Signed)
Patient would like to know if she could start spacing out her Xolair to every 6 weeks. She hasn't been having any break outs. Please advise

## 2019-08-11 NOTE — Telephone Encounter (Signed)
Left message for pt to call us back about this, also noted on flow sheet dr B stating it is ok to go to every 6 weeks with pts xolair.

## 2019-08-12 NOTE — Telephone Encounter (Signed)
Pt rescheduled for 6 weeks for xolair.

## 2019-09-07 ENCOUNTER — Ambulatory Visit: Payer: Self-pay

## 2019-09-21 ENCOUNTER — Other Ambulatory Visit: Payer: Self-pay

## 2019-09-21 ENCOUNTER — Ambulatory Visit (INDEPENDENT_AMBULATORY_CARE_PROVIDER_SITE_OTHER): Payer: BC Managed Care – PPO

## 2019-09-21 DIAGNOSIS — L501 Idiopathic urticaria: Secondary | ICD-10-CM

## 2019-10-30 ENCOUNTER — Other Ambulatory Visit: Payer: Self-pay

## 2019-10-30 ENCOUNTER — Ambulatory Visit (INDEPENDENT_AMBULATORY_CARE_PROVIDER_SITE_OTHER): Payer: BC Managed Care – PPO

## 2019-10-30 DIAGNOSIS — L500 Allergic urticaria: Secondary | ICD-10-CM

## 2019-11-02 ENCOUNTER — Ambulatory Visit: Payer: Self-pay

## 2019-12-07 ENCOUNTER — Ambulatory Visit: Payer: Self-pay

## 2019-12-14 ENCOUNTER — Other Ambulatory Visit: Payer: Self-pay

## 2019-12-14 ENCOUNTER — Ambulatory Visit (INDEPENDENT_AMBULATORY_CARE_PROVIDER_SITE_OTHER): Payer: BC Managed Care – PPO | Admitting: *Deleted

## 2019-12-14 DIAGNOSIS — L500 Allergic urticaria: Secondary | ICD-10-CM | POA: Diagnosis not present

## 2020-01-07 HISTORY — PX: ARTHROGRAM: SHX1187

## 2020-01-25 ENCOUNTER — Ambulatory Visit: Payer: BC Managed Care – PPO

## 2020-01-25 ENCOUNTER — Other Ambulatory Visit: Payer: Self-pay

## 2020-01-25 DIAGNOSIS — L500 Allergic urticaria: Secondary | ICD-10-CM | POA: Diagnosis not present

## 2020-02-29 ENCOUNTER — Ambulatory Visit (INDEPENDENT_AMBULATORY_CARE_PROVIDER_SITE_OTHER): Payer: BC Managed Care – PPO | Admitting: Pediatrics

## 2020-02-29 ENCOUNTER — Other Ambulatory Visit: Payer: Self-pay

## 2020-02-29 ENCOUNTER — Ambulatory Visit (INDEPENDENT_AMBULATORY_CARE_PROVIDER_SITE_OTHER): Payer: BC Managed Care – PPO

## 2020-02-29 ENCOUNTER — Encounter: Payer: Self-pay | Admitting: Pediatrics

## 2020-02-29 VITALS — BP 112/74 | HR 80 | Temp 98.0°F | Resp 18 | Ht 64.0 in | Wt 197.0 lb

## 2020-02-29 DIAGNOSIS — J301 Allergic rhinitis due to pollen: Secondary | ICD-10-CM

## 2020-02-29 DIAGNOSIS — L503 Dermatographic urticaria: Secondary | ICD-10-CM

## 2020-02-29 DIAGNOSIS — L508 Other urticaria: Secondary | ICD-10-CM | POA: Diagnosis not present

## 2020-02-29 DIAGNOSIS — L501 Idiopathic urticaria: Secondary | ICD-10-CM | POA: Diagnosis not present

## 2020-02-29 MED ORDER — EPINEPHRINE 0.3 MG/0.3ML IJ SOAJ: 0.3000 mg | INTRAMUSCULAR | 1 refills | Status: DC | PRN

## 2020-02-29 NOTE — Patient Instructions (Addendum)
Urticaria Continue Zyrtec 10 mg take 1 tablet once a day as needed for itching or runny nose.  May increase to 1 tablet twice a day if itching occurs. May get Xolair injections every 8 weeks.  Allergic rhinitis Continue Zyrtec 10 mg once a day as needed for itching or runny nose. May use fluticasone nasal spray using 2 sprays each nostril once a day as needed for stuffy nose  Continue all other scheduled medications. Please let us know if this treatment plan is not working well for you. Schedule follow-up appointment in 3 months.

## 2020-02-29 NOTE — Progress Notes (Signed)
[address] [place] [address] Dept: [phone]  FOLLOW UP NOTE  Patient ID: Lori Williamson, female    DOB: Aug 20, 1981  Age: 39 y.o. MRN: [id number] Date of Office Visit: 02/29/2020  Assessment  Chief Complaint: Allergies  HPI Lori Williamson is a 39 year old female who presents for follow-up of chronic urticaria and allergic rhinitis.  She was last seen on May 11, 2019 by Dr. Shaune Leeks.  Chronic urticaria is reported as well controlled with the use of Xolair 300 mg injection every 6 weeks and Zyrtec 10 mg once a day.  She has not had any breakthrough hives and is currently not taking montelukast.  She is wondering if she can possibly space out her Xolair injections further than 6 weeks.  She received her Xolair injection today.  Allergic rhinitis is reported as moderately controlled with the use of fluticasone nasal spray 2 sprays each nostril once a day as needed and Zyrtec 10 mg once a day.  She reports occasional clear rhinorrhea, sneezing, nasal congestion and watery eyes after being outside.  She has Pataday that she uses as needed for ocular pruritus.   Drug Allergies:  Allergies  Allergen Reactions  . Sulfa Antibiotics Nausea And Vomiting    Physical Exam: BP 112/74   Pulse 80   Temp 98 F (36.7 C) (Oral)   Resp 18   Ht 5\' 4"  (1.626 m)   Wt 197 lb (89.4 kg)   SpO2 98%   BMI 33.81 kg/m    Physical Exam Constitutional:      Appearance: Normal appearance.  HENT:     Head: Normocephalic and atraumatic.     Comments: Pharynx normal. Eyes normal. Nose normal. Ears normal.    Right Ear: Tympanic membrane, ear canal and external ear normal.     Left Ear: Tympanic membrane, ear canal and external ear normal.     Nose: Nose normal.     Mouth/Throat:     Mouth: Mucous membranes are moist.     Pharynx: Oropharynx is clear.  Eyes:     Conjunctiva/sclera: Conjunctivae normal.  Cardiovascular:     Rate and Rhythm: Normal rate and regular  rhythm.     Heart sounds: Normal heart sounds.  Pulmonary:     Effort: Pulmonary effort is normal.     Breath sounds: Normal breath sounds.     Comments: Lungs clear to auscultation. Musculoskeletal:     Cervical back: Neck supple.  Skin:    General: Skin is warm.  Neurological:     General: No focal deficit present.     Mental Status: She is alert and oriented to person, place, and time.  Psychiatric:        Mood and Affect: Mood normal.        Behavior: Behavior normal.        Thought Content: Thought content normal.        Judgment: Judgment normal.       Assessment and Plan: 1. Chronic urticaria   2. Dermographia   3. Seasonal allergic rhinitis due to pollen     Meds ordered this encounter  Medications  . EPINEPHrine 0.3 mg/0.3 mL IJ SOAJ injection    Sig: Inject 0.3 mLs (0.3 mg total) into the muscle as needed for anaphylaxis.    Dispense:  1 each    Refill:  1    Patient Instructions  Urticaria Continue Zyrtec 10 mg take 1 tablet once a day as needed for itching or runny nose.  May increase to 1 tablet twice a day if itching occurs. May get Xolair injections every 8 weeks.  Allergic rhinitis Continue Zyrtec 10 mg once a day as needed for itching or runny nose. May use fluticasone nasal spray using 2 sprays each nostril once a day as needed for stuffy nose  Continue all other scheduled medications. Please let us know if this treatment plan is not working well for you. Schedule follow-up appointment in 3 months.   Return in about 3 months (around 05/31/2020), or if symptoms worsen or fail to improve.   You for the opportunity to care for this patient.  Please do not hesitate to contact me with questions.  Nehemiah Settle, FNP [facility]   I have provided oversight concerning Sherrie George' evaluation and treatment of this patient's health issues addressed during today's encounter. I agree with the  assessment and therapeutic plan as outlined in the note.   Thank you for the opportunity to care for this patient.  Please do not hesitate to contact me with questions.  Tonette Bihari, M.D.  [facility] [address] [place], [place] [address] [phone]

## 2020-04-12 ENCOUNTER — Ambulatory Visit: Payer: Self-pay

## 2020-04-25 ENCOUNTER — Other Ambulatory Visit: Payer: Self-pay

## 2020-04-25 ENCOUNTER — Ambulatory Visit (INDEPENDENT_AMBULATORY_CARE_PROVIDER_SITE_OTHER): Payer: BC Managed Care – PPO

## 2020-04-25 DIAGNOSIS — L501 Idiopathic urticaria: Secondary | ICD-10-CM

## 2020-05-16 ENCOUNTER — Ambulatory Visit (INDEPENDENT_AMBULATORY_CARE_PROVIDER_SITE_OTHER): Payer: BC Managed Care – PPO | Admitting: Family Medicine

## 2020-05-16 ENCOUNTER — Ambulatory Visit: Payer: BC Managed Care – PPO | Admitting: Pediatrics

## 2020-05-16 ENCOUNTER — Encounter: Payer: Self-pay | Admitting: Family Medicine

## 2020-05-16 ENCOUNTER — Other Ambulatory Visit: Payer: Self-pay

## 2020-05-16 VITALS — BP 116/82 | HR 73 | Temp 98.1°F | Ht 64.0 in | Wt 199.0 lb

## 2020-05-16 DIAGNOSIS — E7800 Pure hypercholesterolemia, unspecified: Secondary | ICD-10-CM | POA: Diagnosis not present

## 2020-05-16 DIAGNOSIS — L989 Disorder of the skin and subcutaneous tissue, unspecified: Secondary | ICD-10-CM

## 2020-05-16 DIAGNOSIS — Z1159 Encounter for screening for other viral diseases: Secondary | ICD-10-CM

## 2020-05-16 DIAGNOSIS — Z0000 Encounter for general adult medical examination without abnormal findings: Secondary | ICD-10-CM

## 2020-05-16 LAB — COMPREHENSIVE METABOLIC PANEL
ALT: 9 U/L (ref 0–35)
AST: 11 U/L (ref 0–37)
Albumin: 4 g/dL (ref 3.5–5.2)
Alkaline Phosphatase: 38 U/L — ABNORMAL LOW (ref 39–117)
BUN: 13 mg/dL (ref 6–23)
CO2: 27 mEq/L (ref 19–32)
Calcium: 9 mg/dL (ref 8.4–10.5)
Chloride: 104 mEq/L (ref 96–112)
Creatinine, Ser: 0.93 mg/dL (ref 0.40–1.20)
GFR: 67.3 mL/min (ref >60.00)
Glucose, Bld: 79 mg/dL (ref 70–99)
Potassium: 3.7 mEq/L (ref 3.5–5.1)
Sodium: 138 mEq/L (ref 135–145)
Total Bilirubin: 0.5 mg/dL (ref 0.2–1.2)
Total Protein: 6.5 g/dL (ref 6.0–8.3)

## 2020-05-16 LAB — CBC
HCT: 39.7 % (ref 36.0–46.0)
Hemoglobin: 13.6 g/dL (ref 12.0–15.0)
MCHC: 34.2 g/dL (ref 30.0–36.0)
MCV: 92.3 fl (ref 78.0–100.0)
Platelets: 276 10*3/uL (ref 150.0–400.0)
RBC: 4.3 Mil/uL (ref 3.87–5.11)
RDW: 12.1 % (ref 11.5–15.5)
WBC: 6.4 10*3/uL (ref 4.0–10.5)

## 2020-05-16 LAB — LIPID PANEL
Cholesterol: 239 mg/dL — ABNORMAL HIGH (ref 0–200)
HDL: 53.2 mg/dL (ref >39.00)
LDL Cholesterol: 169 mg/dL — ABNORMAL HIGH (ref 0–99)
NonHDL: 185.77
Total CHOL/HDL Ratio: 4
Triglycerides: 85 mg/dL (ref 0.0–149.0)
VLDL: 17 mg/dL (ref 0.0–40.0)

## 2020-05-16 NOTE — Patient Instructions (Addendum)
Give [place] 2-3 business days to get the results of your labs back.   Heat (pad or rice pillow in microwave) over affected area, 10-15 minutes twice daily.   Call Center for [facility] at [facility] at [phone] for an appointment.  They are located at [address], [address], [place], [place], [address] (right across the hall from our office).  If you do not hear anything about your referral in the next 1-2 weeks, call our office and ask for an update.  Piriformis Syndrome Rehab It is normal to feel mild stretching, pulling, tightness, or discomfort as you do these exercises, but you should stop right away if you feel sudden pain or your pain gets worse.  Stretching and range of motion exercises These exercises warm up your muscles and joints and improve the movement and flexibility of your hip and pelvis. These exercises also help to relieve pain, numbness, and tingling. Exercise A: Hip rotators   1. Lie on your back on a firm surface. 2. Pull your left / right knee toward your same shoulder with your left / right hand until your knee is pointing toward the ceiling. Hold your left / right ankle with your other hand. 3. Keeping your knee steady, gently pull your left / right ankle toward your other shoulder until you feel a stretch in your buttocks. 4. Hold this position for 30 seconds. Repeat 2 times. Complete this stretch 3 times per week. Exercise B: Hip extensors 1. Lie on your back on a firm surface. Both of your legs should be straight. 2. Pull your left / right knee to your chest. Hold your leg in this position by holding onto the back of your thigh or the front of your knee. 3. Hold this position for 30 seconds. 4. Slowly return to the starting position. Repeat 2 times. Complete this stretch 3 times per week.  Strengthening exercises These exercises build strength and endurance in your hip and thigh muscles. Endurance is the ability to use your muscles for a  long time, even after they get tired. Exercise C: Straight leg raises (hip abductors)    1. Lie on your side with your left / right leg in the top position. Lie so your head, shoulder, knee, and hip line up. Bend your bottom knee to help you balance. 2. Lift your top leg up 4-6 inches (10-15 cm), keeping your toes pointed straight ahead. 3. Hold this position for 1 second. 4. Slowly lower your leg to the starting position. Let your muscles relax completely. Repeat for a total of 10 repetitions. Repeat 2 times. Complete this exercise 3 times per week. Exercise D: Hip abductors and rotators, quadruped   1. Get on your hands and knees on a firm, lightly padded surface. Your hands should be directly below your shoulders, and your knees should be directly below your hips. 2. Lift your left / right knee out to the side. Keep your knee bent. Do not twist your body. 3. Hold this position for 1 seconds. 4. Slowly lower your leg. Repeat for a total of 10 repetitions.  Repeat 1 times. Complete this exercise 3 times per week. Exercise E: Straight leg raises (hip extensors) 1. Lie on your abdomen on a bed or a firm surface with a pillow under your hips. 2. Squeeze your buttock muscles and lift your left / right thigh off the bed. Do not let your back arch. 3. Hold this position for 3 seconds. 4.  Slowly return to the starting position. Let your muscles relax completely before doing another repetition. Repeat 2 times. Complete this exercise 3 times per week.  This information is not intended to replace advice given to you by your health care provider. Make sure you discuss any questions you have with your health care provider. Document Released: 09/24/2005 Document Revised: 05/29/2016 Document Reviewed: 09/06/2015 Elsevier Interactive Patient Education  [employer].

## 2020-05-16 NOTE — Progress Notes (Signed)
Chief Complaint  Patient presents with   Annual Exam     Well Woman Lori Williamson is here for a complete physical.   Her last physical was >1 year ago.  Current diet: in general, a "not good" diet. Current exercise: none; lifting at work Fatigue out of ordinary? No Seatbelt? Yes  Health Maintenance Pap/HPV- No Tetanus- Yes HIV screening- Yes  Past Medical History:  Diagnosis Date   Allergic rhinitis    Hypercholesterolemia    Urticaria      Past Surgical History:  Procedure Laterality Date   ARTHROGRAM Right 01/2020   CHOLECYSTECTOMY  2009    Medications  Current Outpatient Medications on File Prior to Visit  Medication Sig Dispense Refill   cetirizine (ZYRTEC) 10 MG tablet Take 10 mg by mouth daily. Every evening     diphenhydrAMINE (BENADRYL) 25 mg capsule Take 25 mg by mouth as needed. For breakthru     EPINEPHrine 0.3 mg/0.3 mL IJ SOAJ injection Inject 0.3 mLs (0.3 mg total) into the muscle as needed for anaphylaxis. 1 each 1   fluticasone (FLONASE) 50 MCG/ACT nasal spray Place 1 spray into both nostrils daily as needed for allergies (may use two sprays each nostril once a day as needed.).      Probiotic Product (PROBIOTIC ADVANCED PO) Take by mouth 3 x daily with food.     XOLAIR 150 MG injection Inject 150 mg into the skin every 8 (eight) weeks.      Allergies Allergies  Allergen Reactions   Sulfa Antibiotics Nausea And Vomiting    Review of Systems: Constitutional:  no unexpected weight changes Eye:  no recent significant change in vision Ear/Nose/Mouth/Throat:  Ears:  no tinnitus or vertigo and no recent change in hearing Nose/Mouth/Throat:  no complaints of nasal congestion, no sore throat Cardiovascular: no chest pain Respiratory:  no cough and no shortness of breath Gastrointestinal:  no abdominal pain, no change in bowel habits GU:  Female: negative for dysuria or pelvic pain Musculoskeletal/Extremities: +R shoulder pain and L  piriformis syndrome; otherwise no pain of the joints Integumentary (Skin/Breast): +cyst on abd Neurologic:  no headaches Endocrine:  denies fatigue Hematologic/Lymphatic:  No areas of easy bleeding  Exam BP 116/82 (BP Location: Left Arm, Patient Position: Sitting, Cuff Size: Normal)    Pulse 73    Temp 98.1 F (36.7 C) (Oral)    Ht 5\' 4"  (1.626 m)    Wt 199 lb (90.3 kg)    SpO2 97%    BMI 34.16 kg/m  General:  well developed, well nourished, in no apparent distress Skin: on the lower L abd wall, there is a circular lesion with reddish/purple discoloration measuring 4 mm in diameter, no fluctuance or ttp; otherwise no significant moles, warts, or growths Head:  no masses, lesions, or tenderness Eyes:  pupils equal and round, sclera anicteric without injection Ears:  canals without lesions, TMs shiny without retraction, no obvious effusion, no erythema Nose:  nares patent, septum midline, mucosa normal, and no drainage or sinus tenderness Throat/Pharynx:  lips and gingiva without lesion; tongue and uvula midline; non-inflamed pharynx; no exudates or postnasal drainage Neck: neck supple without adenopathy, thyromegaly, or masses Lungs:  clear to auscultation, breath sounds equal bilaterally, no respiratory distress Cardio:  regular rate and rhythm, no bruits, no LE edema Abdomen:  abdomen soft, nontender; bowel sounds normal; no masses or organomegaly Genital: Defer to GYN Musculoskeletal:  symmetrical muscle groups noted without atrophy or deformity Extremities:  no clubbing, cyanosis,  or edema, no deformities, no skin discoloration Neuro:  gait normal; deep tendon reflexes normal and symmetric Psych: well oriented with normal range of affect and appropriate judgment/insight  Assessment and Plan  Well adult exam - Plan: Lipid panel, CBC, Comprehensive metabolic panel  Hypercholesterolemia - Plan: CT CARDIAC SCORING  Encounter for hepatitis C screening test for low risk patient - Plan:  Hepatitis C antibody  Skin lesion - Plan: Ambulatory referral to Dermatology   Well 39 y.o. female. Counseled on diet and exercise. Other orders as above. Will her cholesterol, she would like to see her coronary CT, if anything >0, will start statin. Refer derm for likely cyst.  Follow up in 1 yr or prn. The patient voiced understanding and agreement to the plan.  Jilda Roche [place], DO 05/16/20 9:03 AM

## 2020-05-17 LAB — HEPATITIS C ANTIBODY
Hepatitis C Ab: NONREACTIVE
SIGNAL TO CUT-OFF: 0 (ref <1.00)

## 2020-05-18 ENCOUNTER — Telehealth: Payer: Self-pay | Admitting: Family Medicine

## 2020-05-18 NOTE — Telephone Encounter (Signed)
Called pt left msg to inform her of her CT appt on  05/30/20 9:30 on [facility]. Left message on home machine

## 2020-05-30 ENCOUNTER — Other Ambulatory Visit: Payer: BC Managed Care – PPO

## 2020-06-06 ENCOUNTER — Ambulatory Visit (INDEPENDENT_AMBULATORY_CARE_PROVIDER_SITE_OTHER)
Admission: RE | Admit: 2020-06-06 | Discharge: 2020-06-06 | Disposition: A | Discharge status: Home / Self Care | Payer: Self-pay | Source: Ambulatory Visit | Attending: Family Medicine | Admitting: Family Medicine

## 2020-06-06 ENCOUNTER — Other Ambulatory Visit: Payer: Self-pay

## 2020-06-06 DIAGNOSIS — E7800 Pure hypercholesterolemia, unspecified: Secondary | ICD-10-CM

## 2020-06-20 ENCOUNTER — Encounter: Payer: Self-pay | Admitting: Allergy and Immunology

## 2020-06-20 ENCOUNTER — Ambulatory Visit: Payer: BC Managed Care – PPO

## 2020-06-20 ENCOUNTER — Other Ambulatory Visit: Payer: Self-pay

## 2020-06-20 ENCOUNTER — Ambulatory Visit (INDEPENDENT_AMBULATORY_CARE_PROVIDER_SITE_OTHER): Payer: BC Managed Care – PPO | Admitting: Allergy and Immunology

## 2020-06-20 DIAGNOSIS — J301 Allergic rhinitis due to pollen: Secondary | ICD-10-CM | POA: Diagnosis not present

## 2020-06-20 DIAGNOSIS — L508 Other urticaria: Secondary | ICD-10-CM

## 2020-06-20 DIAGNOSIS — L501 Idiopathic urticaria: Secondary | ICD-10-CM | POA: Diagnosis not present

## 2020-06-20 NOTE — Assessment & Plan Note (Signed)
   Continue appropriate allergen avoidance measures.  Cetirizine 10 mg daily if needed.  Fluticasone nasal spray, 1 to 2 sprays per nostril daily if needed.  Nasal saline spray (i.e., Simply Saline) or nasal saline lavage (i.e., NeilMed) is recommended as needed and prior to medicated nasal sprays.  The risks and benefits of aeroallergen immunotherapy have been discussed. The patient is motivated to initiate immunotherapy if insurance coverage is favorable. She will let us know how she would like to proceed.

## 2020-06-20 NOTE — Patient Instructions (Addendum)
Chronic urticaria  Continue Xolair injections every 8 weeks as prescribed for now.  Continue antihistamines if needed.  Seasonal allergic rhinitis due to pollen  Continue appropriate allergen avoidance measures.  Cetirizine 10 mg daily if needed.  Fluticasone nasal spray, 1 to 2 sprays per nostril daily if needed.  Nasal saline spray (i.e., Simply Saline) or nasal saline lavage (i.e., NeilMed) is recommended as needed and prior to medicated nasal sprays.  The risks and benefits of aeroallergen immunotherapy have been discussed. The patient is motivated to initiate immunotherapy if insurance coverage is favorable. She will let us know how she would like to proceed.   Return in about 6 months (around 12/18/2020), or if symptoms worsen or fail to improve.

## 2020-06-20 NOTE — Assessment & Plan Note (Signed)
   Continue Xolair injections every 8 weeks as prescribed for now.  Continue antihistamines if needed.

## 2020-06-20 NOTE — Progress Notes (Signed)
Follow-up Note  RE: Lori Williamson MRN: [id number] DOB: 07/18/1981 Date of Office Visit: 06/20/2020  Primary care provider: Sharlene Dory, DO Referring provider: Sharlene Dory*  History of present illness: Lori Williamson is a 39 y.o. female with chronic urticaria and allergic rhinitis presented today for follow-up.  She was last seen in this clinic on Feb 29, 2020 by Thermon Leyland, FNP.  She has been receiving Xolair injections every 8 weeks for chronic idiopathic urticaria.  She reports that having started Xolair 3 to 4 years ago her urticaria has been well controlled/suppressed.  Prior to starting the Xolair she had frequent sinus infections and nasal allergy symptoms.  However, while on the Xolair her rhinosinusitis has improved dramatically.  She has noted that since stretching the Xolair injections from every 6 weeks to every 8 weeks she has been experiencing a little more nasal congestion/rhinorrhea.  Assessment and plan: Chronic urticaria  Continue Xolair injections every 8 weeks as prescribed for now.  Continue antihistamines if needed.  Seasonal allergic rhinitis due to pollen  Continue appropriate allergen avoidance measures.  Cetirizine 10 mg daily if needed.  Fluticasone nasal spray, 1 to 2 sprays per nostril daily if needed.  Nasal saline spray (i.e., Simply Saline) or nasal saline lavage (i.e., NeilMed) is recommended as needed and prior to medicated nasal sprays.  The risks and benefits of aeroallergen immunotherapy have been discussed. The patient is motivated to initiate immunotherapy if insurance coverage is favorable. She will let us know how she would like to proceed.   Physical examination: Blood pressure 100/64, pulse 88, temperature 98.4 F (36.9 C), temperature source Oral, resp. rate 20, height 5\' 4"  (1.626 m), weight 197 lb (89.4 kg), last menstrual period 06/01/2020, SpO2 97 %.  General: Alert, interactive, in no acute  distress. HEENT: TMs pearly gray, turbinates moderately edematous without discharge, post-pharynx mildly erythematous. Neck: Supple without lymphadenopathy. Lungs: Clear to auscultation without wheezing, rhonchi or rales. CV: Normal S1, S2 without murmurs. Skin: Warm and dry, without lesions or rashes.  The following portions of the patient's history were reviewed and updated as appropriate: allergies, current medications, past family history, past medical history, past social history, past surgical history and problem list.  Current Outpatient Medications  Medication Sig Dispense Refill  . cetirizine (ZYRTEC) 10 MG tablet Take 10 mg by mouth daily. Every evening    . diphenhydrAMINE (BENADRYL) 25 mg capsule Take 25 mg by mouth as needed. For breakthru    . EPINEPHrine 0.3 mg/0.3 mL IJ SOAJ injection Inject 0.3 mLs (0.3 mg total) into the muscle as needed for anaphylaxis. 1 each 1  . fluticasone (FLONASE) 50 MCG/ACT nasal spray Place 1 spray into both nostrils daily as needed for allergies (may use two sprays each nostril once a day as needed.).     06/03/2020 Probiotic Product (PROBIOTIC ADVANCED PO) Take by mouth 3 x daily with food.    Marland Kitchen 150 MG injection Inject 150 mg into the skin every 8 (eight) weeks.      Current Facility-Administered Medications  Medication Dose Route Frequency Provider Last Rate Last Admin  . omalizumab [name]) injection 300 mg  300 mg Subcutaneous Q28 days [name] A, MD   300 mg at 06/20/20 1532    Allergies  Allergen Reactions  . Sulfa Antibiotics Nausea And Vomiting    I appreciate the opportunity to take part in [facility] care. Please do not hesitate to contact me with questions.  Sincerely,   R.  Edgar Frisk, MD

## 2020-06-29 ENCOUNTER — Telehealth: Payer: Self-pay

## 2020-06-29 NOTE — Telephone Encounter (Signed)
Pt. Called earlier today regarding d/c her xolair injections for 6 months so we can allergy test her. I'm returning pt.'s call from earlier today. Pt. Stated she spoke to someone on Monday regarding stopping her xolair for 6 months so she can be allergy tested. Pt.'s concern is that she was allergy tested 4 years ago not with our and she was dermagraphia. She showed up to everything. Her question is would it be beneficial to get allergy tested if she is so sensitive?

## 2020-06-30 NOTE — Telephone Encounter (Signed)
Spoke with pt about discontinuing Xolair for 6 months to allow for allergy testing.  She will be following all precautions to prevent return of urticaria.  Appt has been made and if pt feels hives cannot be controlled she will call the office to restart Xolair.  Per Dr Nunzio Cobbs: She will use the H1/H2 receptor blockade as needed when off of Xolair. If the urticaria returns and becomes unbearable despite antihistamine use we will plan to restart the Xolair. There is a chance that her urticaria is in remission. If she is uncomfortable about holding the Xolair for several months she does not have to do so, however this is the only way we can be able to test to see what needs to go in immunotherapy vials. After she has been off of Xolair for a sufficient period of time, we will assess to see if she is dermatographic, if she is not dermatographic, we will skin test to aeroallergens. If she is dermatographic we will check serum specific IgE against environmental panel. I will check to see if the full 6 months is still the recommended length of time to be off of Xolair prior to skin testing/serum specific IgE blood work.  Thanks.

## 2020-06-30 NOTE — Telephone Encounter (Signed)
Marylu Lund sent through a similar staff message  - I answered that one. Thanks.

## 2020-07-11 ENCOUNTER — Ambulatory Visit: Payer: BC Managed Care – PPO

## 2020-08-15 ENCOUNTER — Ambulatory Visit: Payer: BC Managed Care – PPO

## 2020-12-19 ENCOUNTER — Encounter: Payer: Self-pay | Admitting: Allergy

## 2020-12-19 ENCOUNTER — Ambulatory Visit: Payer: BC Managed Care – PPO | Admitting: Allergy

## 2020-12-19 ENCOUNTER — Other Ambulatory Visit: Payer: Self-pay

## 2020-12-19 ENCOUNTER — Ambulatory Visit: Payer: BC Managed Care – PPO | Admitting: Allergy and Immunology

## 2020-12-19 VITALS — BP 118/72 | HR 78 | Temp 97.2°F | Resp 20 | Ht 64.5 in | Wt 206.0 lb

## 2020-12-19 DIAGNOSIS — L508 Other urticaria: Secondary | ICD-10-CM

## 2020-12-19 DIAGNOSIS — J301 Allergic rhinitis due to pollen: Secondary | ICD-10-CM

## 2020-12-19 DIAGNOSIS — L503 Dermatographic urticaria: Secondary | ICD-10-CM | POA: Diagnosis not present

## 2020-12-19 MED ORDER — FAMOTIDINE 20 MG PO TABS: 20.0000 mg | ORAL_TABLET | Freq: Two times a day (BID) | ORAL | 5 refills | Status: AC

## 2020-12-19 MED ORDER — FLUTICASONE PROPIONATE 50 MCG/ACT NA SUSP: 1.0000 | Freq: Every day | NASAL | 5 refills | Status: AC | PRN

## 2020-12-19 NOTE — Patient Instructions (Signed)
Chronic urticaria  Remains dermatographic  Will resume Xolair injections.  Will notify Lori Williamson) to re-apply for approval and she will contact you with this information  For management of hives recommend following antihistamine regimen: Zyrtec 10mg  1-2 tabs a day with Pepcid 20mg  1 tab in AM and 1 tab in PM  Continue antihistamines if needed.  Seasonal allergic rhinitis due to pollen  Continue appropriate allergen avoidance measures.  Will obtain serum IgE levels for environmental allergy panel.  If positive then you would qualify for allergen immunotherapy to help with management of allergic rhinitis  Zyrtec daily as above  Can use Fluticasone nasal spray, 1 to 2 sprays per nostril daily if needed.  Nasal saline spray (i.e., Simply Saline) or nasal saline lavage (i.e., NeilMed) is recommended as needed and prior to medicated nasal sprays.  The risks and benefits of aeroallergen immunotherapy have been discussed.     Return in about 3 months or if symptoms worsen or fail to improve.

## 2020-12-19 NOTE — Progress Notes (Signed)
Follow-up Note  RE: Lori Williamson MRN: [id number] DOB: 1981-06-14 Date of Office Visit: 12/19/2020   History of present illness: Lori Williamson is a 40 y.o. female presenting today for follow-up of urticaria and allergic rhinitis.  She was last seen in the office on 06/20/2020 by Dr. Nunzio Cobbs.  At that visit they discussed the possibility of performing environmental allergy testing with hopes to start allergen immunotherapy for her allergic rhinitis.  However she was on Xolair at the time and thus  skin testing may not be very accurate.  Thus it was decided at that time that she hold her Xolair for 6 months in order to perform environmental allergy testing.   However since being off of Xolair for the past 6 months she has had return of hives pretty much on a daily basis.  She states the hives have become more frequent and more severe and she does have more of a tingling and burning sensation with the hives she does do Zyrtec daily and she states that this helps control her hives for the most part.  She also states that she has been off of Xolair she has had more issues with her sinuses and states she did have a sinus infection and bronchitis over the winter.  She states prior to being on Xolair she did get a lot of sinus infections that did improve once she initiated Xolair.  She states she had allergy testing in the past but states she was dermatographic and everything "lit up".    She reports itchy/watery eyes, runny/stuffy nose and sneezing.  Since being off Xolair she does report her eyes have been burning and itching.  She has been using Alcon eye drops (she works for [employer]).    Review of systems: Review of Systems  Constitutional: Negative.   HENT:       See HPI  Eyes:       See HPI  Respiratory: Negative.   Cardiovascular: Negative.   Gastrointestinal: Negative.   Musculoskeletal: Negative.   Skin: Positive for itching and rash.  Neurological: Negative.      All other systems negative unless noted above in HPI  Past medical/social/surgical/family history have been reviewed and are unchanged unless specifically indicated below.  No changes  Medication List: Current Outpatient Medications  Medication Sig Dispense Refill  . cetirizine (ZYRTEC) 10 MG tablet Take 10 mg by mouth daily. Every evening    . diphenhydrAMINE (BENADRYL) 25 mg capsule Take 25 mg by mouth as needed. For breakthru    . EPINEPHrine 0.3 mg/0.3 mL IJ SOAJ injection Inject 0.3 mLs (0.3 mg total) into the muscle as needed for anaphylaxis. 1 each 1  . fluticasone (FLONASE) 50 MCG/ACT nasal spray Place 1 spray into both nostrils daily as needed for allergies (may use two sprays each nostril once a day as needed.).     Marland Kitchen lansoprazole (PREVACID) 15 MG capsule Take 15 mg by mouth daily at 12 noon.    . Probiotic Product (PROBIOTIC ADVANCED PO) Take by mouth 3 x daily with food.    [name] 150 MG injection Inject 150 mg into the skin every 8 (eight) weeks.      Current Facility-Administered Medications  Medication Dose Route Frequency Provider Last Rate Last Admin  . omalizumab [name]) injection 300 mg  300 mg Subcutaneous Q28 days Stephannie Li A, MD   300 mg at 06/20/20 1532     Known medication allergies: Allergies  Allergen Reactions  . Sulfa  Antibiotics Nausea And Vomiting     Physical examination: Blood pressure 118/72, pulse 78, temperature (!) 97.2 F (36.2 C), temperature source Tympanic, resp. rate 20, height 5' 4.5" (1.638 m), weight 206 lb (93.4 kg), SpO2 98 %.  General: Alert, interactive, in no acute distress. HEENT: PERRLA, TMs pearly gray, turbinates minimally edematous without discharge, post-pharynx non erythematous. Neck: Supple without lymphadenopathy. Lungs: Clear to auscultation without wheezing, rhonchi or rales. {no increased work of breathing. CV: Normal S1, S2 without murmurs. Abdomen: Nondistended, nontender. Skin: Scattered erythematous  urticarial type lesions primarily located Left forearm , nonvesicular. Extremities:  No clubbing, cyanosis or edema. Neuro:   Grossly intact.  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  Will notify [employer]administrative [employer]) to re-apply for approval and she will contact you with this information  For management of hives recommend following antihistamine regimen: Zyrtec 10mg  1-2 tabs a day with Pepcid 20mg  1 tab in AM and 1 tab in PM  Continue antihistamines if needed.  Seasonal allergic rhinitis due to pollen  Continue appropriate allergen avoidance measures.  Will obtain serum IgE levels for environmental allergy panel.  If positive then you would qualify for allergen immunotherapy to help with management of allergic rhinitis  Zyrtec daily as above  Can use Fluticasone nasal spray, 1 to 2 sprays per nostril daily if needed.  Nasal saline spray (i.e., Simply Saline) or nasal saline lavage (i.e., NeilMed) is recommended as needed and prior to medicated nasal sprays.  The risks and benefits of aeroallergen immunotherapy have been discussed.     Return in about 3 months or if symptoms worsen or fail to improve.  I appreciate the opportunity to take part in [place] care. Please do not hesitate to contact me with questions.  Sincerely,   , MD Allergy/Immunology [facility]

## 2020-12-23 LAB — ALLERGENS W/TOTAL IGE AREA 2

## 2020-12-26 ENCOUNTER — Telehealth: Payer: Self-pay | Admitting: *Deleted

## 2020-12-26 MED ORDER — OMALIZUMAB 150 MG/ML ~~LOC~~ SOSY: 300.0000 mg | PREFILLED_SYRINGE | SUBCUTANEOUS | 11 refills | Status: DC

## 2020-12-26 NOTE — Telephone Encounter (Signed)
Called patient and advised approval and submit for Xolair to restart. Will send new Rx to [facility] and will reach out to patient once delivery set to advise so she can make appt to restart.

## 2020-12-26 NOTE — Telephone Encounter (Signed)
-----   Message from [facility] Larose Hires, MD sent at 12/19/2020  1:07 PM EDT ----- Patient needs to restart Xolair for hives.  Thanks

## 2021-01-18 ENCOUNTER — Telehealth: Payer: Self-pay | Admitting: *Deleted

## 2021-01-18 NOTE — Telephone Encounter (Signed)
Called patient and advised that her Xolair due to deliver 4/14 and can call in pm to see ifarrives and schedule to restart therapy

## 2021-02-06 ENCOUNTER — Other Ambulatory Visit: Payer: Self-pay

## 2021-02-06 ENCOUNTER — Ambulatory Visit (INDEPENDENT_AMBULATORY_CARE_PROVIDER_SITE_OTHER): Payer: BC Managed Care – PPO

## 2021-02-06 DIAGNOSIS — L508 Other urticaria: Secondary | ICD-10-CM

## 2021-02-06 DIAGNOSIS — L501 Idiopathic urticaria: Secondary | ICD-10-CM

## 2021-03-07 ENCOUNTER — Other Ambulatory Visit: Payer: Self-pay

## 2021-03-07 ENCOUNTER — Ambulatory Visit: Payer: BC Managed Care – PPO

## 2021-03-07 DIAGNOSIS — L501 Idiopathic urticaria: Secondary | ICD-10-CM

## 2021-03-27 ENCOUNTER — Encounter: Payer: Self-pay | Admitting: Allergy

## 2021-03-27 ENCOUNTER — Other Ambulatory Visit: Payer: Self-pay

## 2021-03-27 ENCOUNTER — Ambulatory Visit: Payer: BC Managed Care – PPO | Admitting: Allergy

## 2021-03-27 VITALS — BP 114/64 | HR 68 | Temp 98.3°F | Resp 18

## 2021-03-27 DIAGNOSIS — J310 Chronic rhinitis: Secondary | ICD-10-CM

## 2021-03-27 DIAGNOSIS — L503 Dermatographic urticaria: Secondary | ICD-10-CM

## 2021-03-27 DIAGNOSIS — L508 Other urticaria: Secondary | ICD-10-CM

## 2021-03-27 NOTE — Progress Notes (Signed)
Follow-up Note  RE: Lori Williamson MRN: [id number] DOB: 05/10/1981 Date of Office Visit: 03/27/2021   History of present illness: Lori Williamson is a 40 y.o. female presenting today for follow-up of chronic urticaria and rhinitis with history of recurrent sinusitis.  She was last seen in the office on 12/19/2020 by myself.  She was able to resume on Xolair injections after the last visit.  she states she has been doing much better back on Xolair.  Yesterday she developed a scratch on her leg that she states she usually would set off an urticarial event.  However she states she felt a bit itchy of her upper body and took zyrtec and this relieved symptoms.  Thus she states Xolair is helping as without xolair she would've had a hive outbreak.  She states she was taking zyrtec daily after her first dose of xolair but now that she has had 2 doses she has not needed zyrtec as much.  She also is not currently taking pepcid but has it available at home.   She states her sinus allergy symptoms are under much better control with Xolair back on board as well.  She is not noticing near degree of nasal congestion or pressure.  She has not needed t ouse flonase.    Review of systems: Review of Systems  Constitutional: Negative.   HENT: Negative.    Eyes: Negative.   Respiratory: Negative.    Cardiovascular: Negative.   Gastrointestinal: Negative.   Musculoskeletal: Negative.   Skin:        See HPI  Neurological: Negative.    All other systems negative unless noted above in HPI  Past medical/social/surgical/family history have been reviewed and are unchanged unless specifically indicated below.  No changes  Medication List: Current Outpatient Medications  Medication Sig Dispense Refill   cetirizine (ZYRTEC) 10 MG tablet Take 10 mg by mouth daily. Every evening     diphenhydrAMINE (BENADRYL) 25 mg capsule Take 25 mg by mouth as needed. For breakthru     famotidine (PEPCID) 20  MG tablet Take 1 tablet (20 mg total) by mouth 2 (two) times daily. 60 tablet 5   fluticasone (FLONASE) 50 MCG/ACT nasal spray Place 1-2 sprays into both nostrils daily as needed for allergies. 16 mL 5   lansoprazole (PREVACID) 15 MG capsule Take 15 mg by mouth daily at 12 noon.     omalizumab [name]) 150 MG/ML prefilled syringe Inject 300 mg into the skin every 28 (twenty-eight) days. 2 mL 11   Current Facility-Administered Medications  Medication Dose Route Frequency Provider Last Rate Last Admin   omalizumab [name]) injection 300 mg  300 mg Subcutaneous Q28 days Stephannie Li A, MD   300 mg at 03/07/21 7829     Known medication allergies: Allergies  Allergen Reactions   Sulfa Antibiotics Nausea And Vomiting     Physical examination: Blood pressure 114/64, pulse 68, temperature 98.3 F (36.8 C), temperature source Temporal, resp. rate 18, SpO2 99 %.  General: Alert, interactive, in no acute distress. HEENT: PERRLA, TMs pearly gray, turbinates minimally edematous without discharge, post-pharynx non erythematous. Neck: Supple without lymphadenopathy. Lungs: Clear to auscultation without wheezing, rhonchi or rales. {no increased work of breathing. CV: Normal S1, S2 without murmurs. Abdomen: Nondistended, nontender. Skin: Warm and dry, without lesions or rashes. Extremities:  No clubbing, cyanosis or edema. Neuro:   Grossly intact.  Diagnositics/Labs: Labs:  Component     Latest Ref Rng & Units 12/19/2020  IgE (Immunoglobulin  E), Serum     6 - 495 IU/mL 50  D Pteronyssinus IgE     Class 0 kU/L <0.10  D Farinae IgE     Class 0 kU/L <0.10  Cat Dander IgE     Class 0 kU/L <0.10  Dog Dander IgE     Class 0 kU/L <0.10  [place] Grass IgE     Class 0 kU/L <0.10  Timothy Grass IgE     Class 0 kU/L <0.10  [place] Grass IgE     Class 0 kU/L <0.10  Cockroach, German IgE     Class 0 kU/L <0.10  Penicillium Chrysogen IgE     Class 0 kU/L <0.10  Cladosporium Herbarum IgE      Class 0 kU/L <0.10  Aspergillus Fumigatus IgE     Class 0 kU/L <0.10  Alternaria Alternata IgE     Class 0 kU/L <0.10  Maple/Box Elder IgE     Class 0 kU/L <0.10  Common Silver Charletta Cousin IgE     Class 0 kU/L <0.10  Cedar, [place] IgE     Class 0 kU/L <0.10  Oak, White IgE     Class 0 kU/L <0.10  Elm, American IgE     Class 0 kU/L <0.10  C[place]wood IgE     Class 0 kU/L <0.10  Pecan, Hickory IgE     Class 0 kU/L <0.10  White Mulberry IgE     Class 0 kU/L <0.10  Ragweed, Short IgE     Class 0 kU/L <0.10  Pigweed, Rough IgE     Class 0 kU/L <0.10  Sheep Sorrel IgE Qn     Class 0 kU/L <0.10  Mouse Urine IgE     Class 0 kU/L <0.10    Assessment and plan:   Chronic urticaria She is also quite dermatographic Urticaria improved with resuming Xolair.  Continue Xolair injections 300 mg every 4 weeks at this time.  If remains under really good control at next visit consider weaning to 300mg  every 6 weeks.  For management of hives if needed recommend following antihistamine regimen: Zyrtec 10mg  1-2 tabs a day with Pepcid 20mg     Rhinitis Environmental allergy panel was negative despite being off Xolair for approximately 6 months  It is possible that you have local allergic rhinitis where systemic allergy testing via skin testing or blood testing is negative but if challenged locally may be positive.  We do not have capabilities to do local allergy testing. Zyrtec as needed Can use Fluticasone nasal spray, 1 to 2 sprays per nostril daily if needed. Nasal saline spray (i.e., Simply Saline) or nasal saline lavage (i.e., NeilMed) is recommended as needed and prior to medicated nasal sprays.   Return in about 6 months or sooner if needed  I appreciate the opportunity to take part in [place] care. Please do not hesitate to contact me with questions.  Sincerely,   , MD Allergy/Immunology [facility]

## 2021-03-27 NOTE — Patient Instructions (Addendum)
Chronic urticaria She is also quite dermatographic Urticaria improved with resuming Xolair.  Continue Xolair injections 300 mg every 4 weeks at this time For management of hives if needed recommend following antihistamine regimen: Zyrtec 10mg  1-2 tabs a day with Pepcid 20mg     Rhinitis Environmental allergy panel was negative despite being off Xolair for approximately 6 months  It is possible that you have local allergic rhinitis where systemic allergy testing via skin testing or blood testing is negative but if challenged locally may be positive.  We do not have capabilities to do local allergy testing. Zyrtec as needed Can use Fluticasone nasal spray, 1 to 2 sprays per nostril daily if needed. Nasal saline spray (i.e., Simply Saline) or nasal saline lavage (i.e., NeilMed) is recommended as needed and prior to medicated nasal sprays.   Return in about 6 months or sooner if needed

## 2021-04-07 ENCOUNTER — Telehealth: Payer: Self-pay

## 2021-04-07 NOTE — Telephone Encounter (Signed)
Could you schedule this please  

## 2021-04-07 NOTE — Telephone Encounter (Signed)
Pt called to get an appointment early next week. I can not find anything in the mornings (since this works better for the pt) . Can I use the 10:15 am- same day slot on 04/11/21.

## 2021-04-07 NOTE — Telephone Encounter (Signed)
That is fine to use that time slot

## 2021-04-11 ENCOUNTER — Ambulatory Visit: Payer: BC Managed Care – PPO

## 2021-04-11 ENCOUNTER — Encounter: Payer: Self-pay | Admitting: Family Medicine

## 2021-04-11 ENCOUNTER — Ambulatory Visit: Payer: BC Managed Care – PPO | Admitting: Family Medicine

## 2021-04-11 ENCOUNTER — Other Ambulatory Visit: Payer: Self-pay

## 2021-04-11 VITALS — BP 132/86 | HR 77 | Temp 98.6°F | Ht 64.0 in | Wt 208.2 lb

## 2021-04-11 DIAGNOSIS — L500 Allergic urticaria: Secondary | ICD-10-CM | POA: Diagnosis not present

## 2021-04-11 DIAGNOSIS — H8113 Benign paroxysmal vertigo, bilateral: Secondary | ICD-10-CM | POA: Diagnosis not present

## 2021-04-11 MED ORDER — MECLIZINE HCL 25 MG PO TABS: 25.0000 mg | ORAL_TABLET | Freq: Three times a day (TID) | ORAL | 0 refills | Status: DC | PRN

## 2021-04-11 NOTE — Progress Notes (Signed)
Chief Complaint  Patient presents with   Dizziness    Lori Williamson is 40 y.o. pt here for dizziness.  Duration: 2 weeks Lasts for 30-60 s. Brought on by movement.  Pass out? No Spinning? Yes Recent illness/fever? No Headache? No Neurologic signs? No Change in PO intake? No She did try the Epley maneuvers w minimal relief.  Using Antivert at home which did decrease severity of episodes when she would move her head.   Past Medical History:  Diagnosis Date   Allergic rhinitis    Hypercholesterolemia    Urticaria     Family History  Problem Relation Age of Onset   Diabetes Mother    Hypertension Mother    Hypercholesterolemia Mother    Arthritis Mother    Allergic rhinitis Father    Allergic rhinitis Brother    Hypercholesterolemia Brother    Angioedema Neg Hx    Asthma Neg Hx    Eczema Neg Hx    Immunodeficiency Neg Hx    Urticaria Neg Hx     Allergies as of 04/11/2021       Reactions   Sulfa Antibiotics Nausea And Vomiting        Medication List        Accurate as of April 11, 2021 11:30 AM. If you have any questions, ask your nurse or doctor.          cetirizine 10 MG tablet Commonly known as: ZYRTEC Take 10 mg by mouth daily. Every evening   diphenhydrAMINE 25 mg capsule Commonly known as: BENADRYL Take 25 mg by mouth as needed. For breakthru   famotidine 20 MG tablet Commonly known as: Pepcid Take 1 tablet (20 mg total) by mouth 2 (two) times daily.   fluticasone 50 MCG/ACT nasal spray Commonly known as: FLONASE Place 1-2 sprays into both nostrils daily as needed for allergies.   lansoprazole 15 MG capsule Commonly known as: PREVACID Take 15 mg by mouth daily at 12 noon.   meclizine 25 MG tablet Commonly known as: ANTIVERT Take 1 tablet (25 mg total) by mouth 3 (three) times daily as needed for dizziness. Started by: Sharlene Dory, DO   omalizumab 150 MG/ML prefilled syringe Commonly known as: Xolair Inject 300 mg  into the skin every 28 (twenty-eight) days.        BP 132/86   Pulse 77   Temp 98.6 F (37 C) (Oral)   Ht 5\' 4"  (1.626 m)   Wt 208 lb 4 oz (94.5 kg)   SpO2 93%   BMI 35.75 kg/m  General: Awake, alert, appears stated age Eyes: PERRLA, EOMi Ears: Patent, TM's neg b/l Heart: RRR, no murmurs, no carotid bruits Lungs: CTAB, no accessory muscle use MSK: 5/5 strength throughout, gait normal Neuro: No cerebellar signs, patellar reflex 2/4 b/l wo clonus, calcaneal reflex 1/4 b/l wo clonus, biceps reflex 2/4 b/l wo clonus; Dix-Hall-Pike positive on R, held off on doing on L given her reaction to the R side. Psych: Age appropriate judgment and insight, normal mood and affect  Benign paroxysmal positional vertigo due to bilateral vestibular disorder - Plan: meclizine (ANTIVERT) 25 MG tablet  OK to cont Antivert. Epley maneuvers discussed. Does not sound like labyrinthitis, CVA, lightheadedness/orthostasis. Stay hydrated. Vest rehab if no better.  F/u prn. Pt voiced understanding and agreement to the plan.  Greater than 30 minutes (around 35) were spent with the patient in addition to reviewing their chart information on the same day of the visit.  Jilda Roche [place], DO 04/11/21 11:30 AM

## 2021-04-11 NOTE — Patient Instructions (Addendum)
Stay hydrated.   Consider YouTube for instructional videos on how to do the Epley maneuvers.  Let us know if you need anything.

## 2021-04-14 ENCOUNTER — Ambulatory Visit: Payer: BC Managed Care – PPO | Admitting: Family Medicine

## 2021-05-08 ENCOUNTER — Ambulatory Visit: Payer: Self-pay

## 2021-05-10 ENCOUNTER — Ambulatory Visit: Payer: Self-pay

## 2021-05-11 ENCOUNTER — Other Ambulatory Visit: Payer: Self-pay

## 2021-05-11 ENCOUNTER — Ambulatory Visit: Payer: BC Managed Care – PPO

## 2021-05-11 DIAGNOSIS — L501 Idiopathic urticaria: Secondary | ICD-10-CM | POA: Diagnosis not present

## 2021-05-22 ENCOUNTER — Other Ambulatory Visit: Payer: Self-pay

## 2021-05-22 ENCOUNTER — Ambulatory Visit (INDEPENDENT_AMBULATORY_CARE_PROVIDER_SITE_OTHER): Payer: BC Managed Care – PPO | Admitting: Family Medicine

## 2021-05-22 ENCOUNTER — Encounter: Payer: Self-pay | Admitting: Family Medicine

## 2021-05-22 VITALS — BP 120/72 | HR 79 | Temp 97.9°F | Ht 64.0 in | Wt 207.0 lb

## 2021-05-22 DIAGNOSIS — Z0000 Encounter for general adult medical examination without abnormal findings: Secondary | ICD-10-CM | POA: Diagnosis not present

## 2021-05-22 DIAGNOSIS — Z23 Encounter for immunization: Secondary | ICD-10-CM

## 2021-05-22 LAB — COMPREHENSIVE METABOLIC PANEL
ALT: 9 U/L (ref 0–35)
AST: 12 U/L (ref 0–37)
Albumin: 4.1 g/dL (ref 3.5–5.2)
Alkaline Phosphatase: 45 U/L (ref 39–117)
BUN: 14 mg/dL (ref 6–23)
CO2: 28 mEq/L (ref 19–32)
Calcium: 9 mg/dL (ref 8.4–10.5)
Chloride: 106 mEq/L (ref 96–112)
Creatinine, Ser: 1.02 mg/dL (ref 0.40–1.20)
GFR: 69.31 mL/min (ref >60.00)
Glucose, Bld: 78 mg/dL (ref 70–99)
Potassium: 4.1 mEq/L (ref 3.5–5.1)
Sodium: 140 mEq/L (ref 135–145)
Total Bilirubin: 0.6 mg/dL (ref 0.2–1.2)
Total Protein: 6.2 g/dL (ref 6.0–8.3)

## 2021-05-22 LAB — CBC
HCT: 40.5 % (ref 36.0–46.0)
Hemoglobin: 13.7 g/dL (ref 12.0–15.0)
MCHC: 33.9 g/dL (ref 30.0–36.0)
MCV: 91.7 fl (ref 78.0–100.0)
Platelets: 268 10*3/uL (ref 150.0–400.0)
RBC: 4.41 Mil/uL (ref 3.87–5.11)
RDW: 12.1 % (ref 11.5–15.5)
WBC: 6.8 10*3/uL (ref 4.0–10.5)

## 2021-05-22 LAB — LIPID PANEL
Cholesterol: 241 mg/dL — ABNORMAL HIGH (ref 0–200)
HDL: 48.1 mg/dL (ref >39.00)
LDL Cholesterol: 177 mg/dL — ABNORMAL HIGH (ref 0–99)
NonHDL: 192.5
Total CHOL/HDL Ratio: 5
Triglycerides: 79 mg/dL (ref 0.0–149.0)
VLDL: 15.8 mg/dL (ref 0.0–40.0)

## 2021-05-22 NOTE — Patient Instructions (Addendum)
Give us 2-3 business days to get the results of your labs back.   Keep the diet clean and stay active.  I recommend getting the flu shot in mid October. This suggestion would change if the CDC comes out with a different recommendation.   Call [facility] [place] [facility] at [phone] for an appointment.  They are located at [address], [address], [place], [place], [address] (right across the hall from our office).  Let us know if you need anything. 

## 2021-05-22 NOTE — Progress Notes (Signed)
Chief Complaint  Patient presents with   Annual Exam     Well Woman Lori Williamson is here for a complete physical.   Her last physical was >1 year ago.  Current diet: in general, a "bad" diet. Current exercise: none. Fatigue out of ordinary? No Seatbelt? Yes  Health Maintenance Pap/HPV- No Tetanus- Yes HIV screening- Yes Hep C screening- Yes  Past Medical History:  Diagnosis Date   Allergic rhinitis    Hypercholesterolemia    Urticaria      Past Surgical History:  Procedure Laterality Date   ARTHROGRAM Right 01/2020   CHOLECYSTECTOMY  2009    Medications  Current Outpatient Medications on File Prior to Visit  Medication Sig Dispense Refill   cetirizine (ZYRTEC) 10 MG tablet Take 10 mg by mouth daily. Every evening     diphenhydrAMINE (BENADRYL) 25 mg capsule Take 25 mg by mouth as needed. For breakthru     famotidine (PEPCID) 20 MG tablet Take 1 tablet (20 mg total) by mouth 2 (two) times daily. 60 tablet 5   fluticasone (FLONASE) 50 MCG/ACT nasal spray Place 1-2 sprays into both nostrils daily as needed for allergies. 16 mL 5   lansoprazole (PREVACID) 15 MG capsule Take 15 mg by mouth daily at 12 noon.     meclizine (ANTIVERT) 25 MG tablet Take 1 tablet (25 mg total) by mouth 3 (three) times daily as needed for dizziness. 90 tablet 0   omalizumab (XOLAIR) 150 MG/ML prefilled syringe Inject 300 mg into the skin every 28 (twenty-eight) days. 2 mL 11   Allergies Allergies  Allergen Reactions   Sulfa Antibiotics Nausea And Vomiting    Review of Systems: Constitutional:  no unexpected weight changes Eye:  no recent significant change in vision Ear/Nose/Mouth/Throat:  Ears:  no tinnitus or vertigo and no recent change in hearing Nose/Mouth/Throat:  no complaints of nasal congestion, no sore throat Cardiovascular: no chest pain Respiratory:  no cough and no shortness of breath Gastrointestinal:  no abdominal pain, no change in bowel habits GU:  Female:  negative for dysuria or pelvic pain Musculoskeletal/Extremities:  no pain of the joints Integumentary (Skin/Breast):  no abnormal skin lesions reported Neurologic:  no headaches Endocrine:  denies fatigue Hematologic/Lymphatic:  No areas of easy bleeding  Exam BP 120/72   Pulse 79   Temp 97.9 F (36.6 C) (Oral)   Ht 5\' 4"  (1.626 m)   Wt 207 lb (93.9 kg)   SpO2 98%   BMI 35.53 kg/m  General:  well developed, well nourished, in no apparent distress Skin:  no significant moles, warts, or growths Head:  no masses, lesions, or tenderness Eyes:  pupils equal and round, sclera anicteric without injection Ears:  canals without lesions, TMs shiny without retraction, no obvious effusion, no erythema Nose:  nares patent, septum midline, mucosa normal, and no drainage or sinus tenderness Throat/Pharynx:  lips and gingiva without lesion; tongue and uvula midline; non-inflamed pharynx; no exudates or postnasal drainage Neck: neck supple without adenopathy, thyromegaly, or masses Lungs:  clear to auscultation, breath sounds equal bilaterally, no respiratory distress Cardio:  regular rate and rhythm, no bruits, no LE edema Abdomen:  abdomen soft, nontender; bowel sounds normal; no masses or organomegaly Genital: Defer to GYN Musculoskeletal:  symmetrical muscle groups noted without atrophy or deformity Extremities:  no clubbing, cyanosis, or edema, no deformities, no skin discoloration Neuro:  gait normal; deep tendon reflexes normal and symmetric Psych: well oriented with normal range of affect and appropriate judgment/insight  Assessment and Plan  Well adult exam - Plan: Comprehensive metabolic panel, Lipid panel, CBC  Need for Tdap vaccination - Plan: Tdap vaccine greater than or equal to 7yo IM  Need for hepatitis A vaccination - Plan: Hepatitis A vaccine adult IM   Well 40 y.o. female. Counseled on diet and exercise. GYN info today. Flu shot rec'd for Oct. 1st Hep A today, 2nd in 6  mo. Other orders as above. Follow up in 1 yr for CPE or prn. The patient voiced understanding and agreement to the plan.  Lori Roche [place], DO 05/22/21 8:42 AM

## 2021-05-24 ENCOUNTER — Encounter: Payer: Self-pay | Admitting: Family Medicine

## 2021-05-24 ENCOUNTER — Telehealth: Payer: Self-pay

## 2021-05-24 NOTE — Telephone Encounter (Signed)
Letter and [facility] printed. Emailed to the patient

## 2021-05-24 NOTE — Telephone Encounter (Signed)
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  Wants letter emailed to   [email or web address]

## 2021-06-13 ENCOUNTER — Other Ambulatory Visit: Payer: Self-pay

## 2021-06-13 ENCOUNTER — Ambulatory Visit (INDEPENDENT_AMBULATORY_CARE_PROVIDER_SITE_OTHER): Payer: BC Managed Care – PPO

## 2021-06-13 DIAGNOSIS — L500 Allergic urticaria: Secondary | ICD-10-CM | POA: Diagnosis not present

## 2021-07-17 ENCOUNTER — Ambulatory Visit: Payer: BC Managed Care – PPO

## 2021-07-17 ENCOUNTER — Other Ambulatory Visit: Payer: Self-pay

## 2021-07-17 DIAGNOSIS — L501 Idiopathic urticaria: Secondary | ICD-10-CM | POA: Diagnosis not present

## 2021-08-28 ENCOUNTER — Other Ambulatory Visit: Payer: Self-pay | Admitting: Family Medicine

## 2021-08-28 ENCOUNTER — Other Ambulatory Visit: Payer: Self-pay

## 2021-08-28 ENCOUNTER — Telehealth: Payer: Self-pay

## 2021-08-28 ENCOUNTER — Ambulatory Visit (INDEPENDENT_AMBULATORY_CARE_PROVIDER_SITE_OTHER): Payer: BC Managed Care – PPO

## 2021-08-28 DIAGNOSIS — L501 Idiopathic urticaria: Secondary | ICD-10-CM | POA: Diagnosis not present

## 2021-08-28 NOTE — Telephone Encounter (Signed)
Dr. Delorse Lek please advise  Patient stated she has been having dry mouth, arm whelping up, face hot to touch, cotton mouth. Flushed and hot. She also has Dermographia. Patient received 300mg  of Xolair around 9:00 am this morning. I told her should could take an antihistamine to see if it clears the whelps up. Please advise. No trouble breathing.     [place]ri[place] [phone]

## 2021-08-28 NOTE — Progress Notes (Signed)
Patient reports that she began to experience hives on her arms and wrists after going to work. She received her Xolair at 9:15 and began to notice symptoms beginning at 11 am and worsening after about an hour. She also reports facial flushing and dry mouth as well as 4 episodes of diarrhea. She denies cardiopulmonary symptoms. She reports that after taking cetirizine her symptoms have started to resolve. Future recommendations include staying at the clinic for an hour following the injection, have a set of epinephrine auto-injectors at all times, and get a tryptase when inflamed next time.

## 2021-08-28 NOTE — Telephone Encounter (Signed)
Patient stated she has been having dry mouth, arm whelping up, face hot to touch, cotton mouth. Flushed and hot. She also has Dermographia. Patient received 300mg  of Xolair around 9:00 am this morning. I told her should could take an antihistamine to see if it clears the whelps up. Please advise. No trouble breathing.    [place] [phone]

## 2021-08-30 NOTE — Telephone Encounter (Signed)
Left a message for patient to call back with an update of her reaction.

## 2021-09-05 NOTE — Telephone Encounter (Signed)
Called to check up on patient, left a message for her to call back.

## 2021-09-08 NOTE — Telephone Encounter (Signed)
Informed pt of this regiment and she will try it out an let us know how it worked for her

## 2021-09-08 NOTE — Telephone Encounter (Signed)
Reaction was after injection last week. 9am had injection, by 1030-11 she noticed some bubbles on skin by [phone] she had really bad cotton mouth, she does have pics of the reaction. It was localized to different areas on body and she got really flushed on her face

## 2021-09-25 ENCOUNTER — Other Ambulatory Visit: Payer: Self-pay

## 2021-09-25 ENCOUNTER — Ambulatory Visit (INDEPENDENT_AMBULATORY_CARE_PROVIDER_SITE_OTHER): Payer: BC Managed Care – PPO

## 2021-09-25 DIAGNOSIS — L501 Idiopathic urticaria: Secondary | ICD-10-CM

## 2021-10-23 ENCOUNTER — Ambulatory Visit (INDEPENDENT_AMBULATORY_CARE_PROVIDER_SITE_OTHER): Payer: BC Managed Care – PPO | Admitting: *Deleted

## 2021-10-23 DIAGNOSIS — L501 Idiopathic urticaria: Secondary | ICD-10-CM | POA: Diagnosis not present

## 2021-10-23 DIAGNOSIS — J4540 Moderate persistent asthma, uncomplicated: Secondary | ICD-10-CM

## 2021-11-12 IMAGING — CT CT CARDIAC CORONARY ARTERY CALCIUM SCORE
3 series · 14 of 20 positions shown, 15 images · non-contrast
Comparison: None.
COMPARISON: None.

Addendum:
EXAM:
OVER-READ INTERPRETATION  CT CHEST

The following report is an over-read performed by radiologist Dr.
Miumiu Doronio [REDACTED] on 06/06/2020. This
over-read does not include interpretation of cardiac or coronary
anatomy or pathology. The coronary calcium score/coronary CTA
interpretation by the cardiologist is attached.
CLINICAL DATA: Risk stratification
Coronary Calcium Score
TECHNIQUE: The patient was scanned on a Siemens Force scanner. Axial
non-contrast 3 mm slices were carried out through the heart. The
data set was analyzed on a dedicated work station and scored using
the Agatson method.

[Series 2: casc 3.0 bv41 2 bestdiast 71 % · axial · 0.34mm/px · z∈[-200,-136]mm · 4 of 35 slices shown, 5 images]
[im 7/35  vessel]
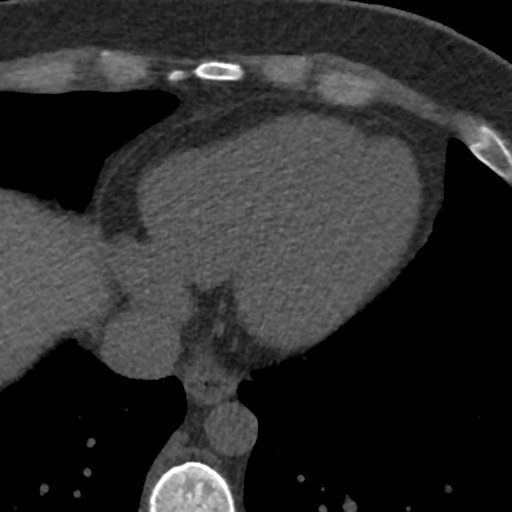
[im 7/35  lung]
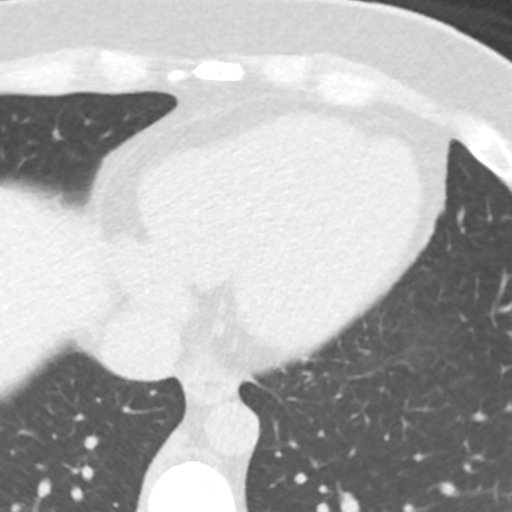
[im 14/35  vessel]
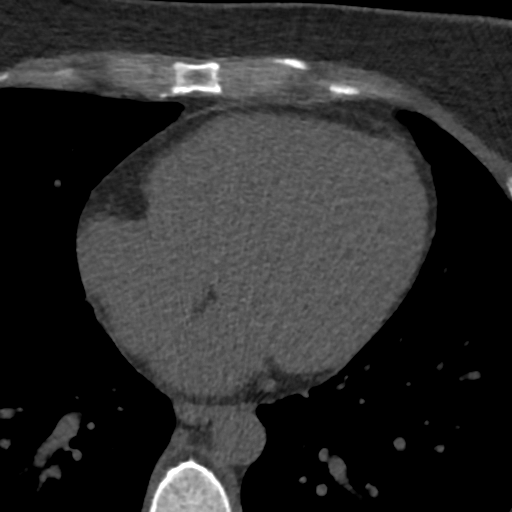
[im 21/35  vessel]
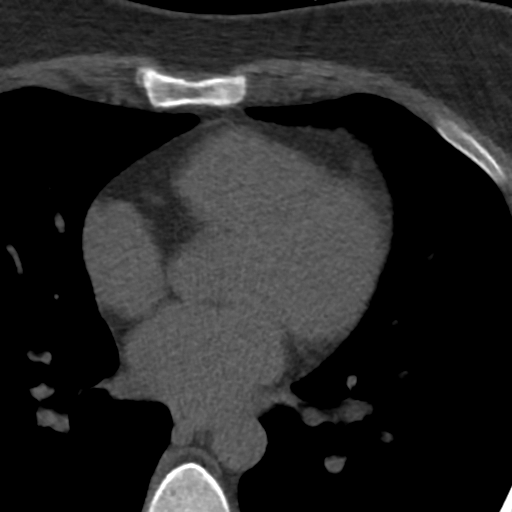
[im 28/35  vessel]
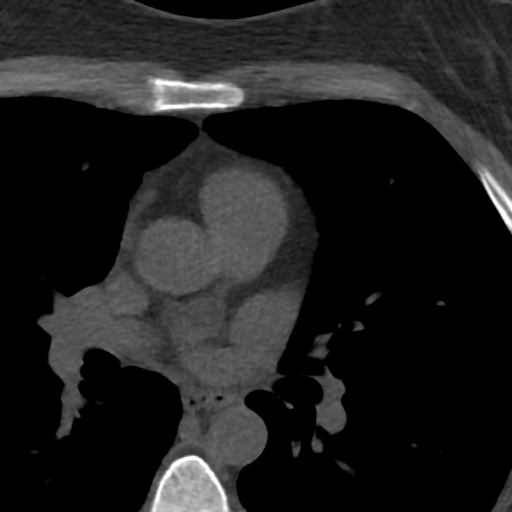

[Series 3: lung 72 % · axial · 0.65mm/px · z∈[-202,-134]mm · 5 of 35 slices shown]
[im 6/35  lung]
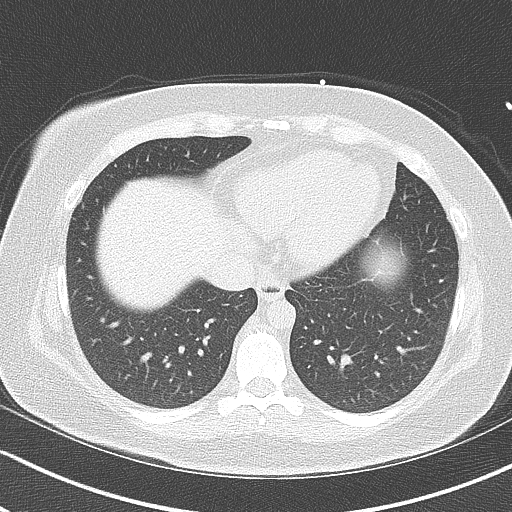
[im 12/35  lung]
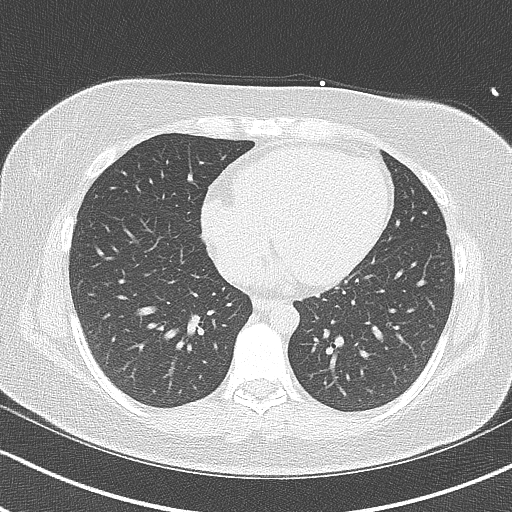
[im 18/35  lung]
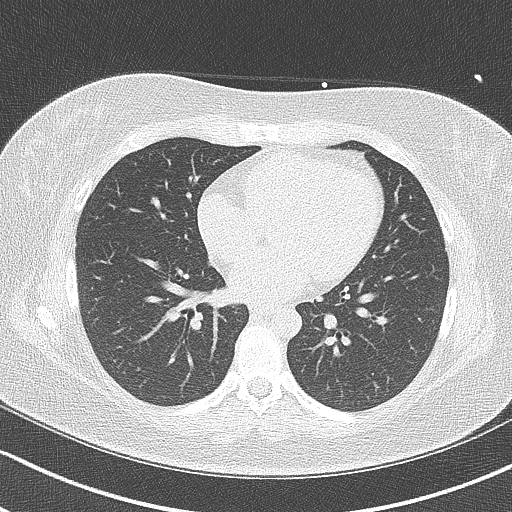
[im 23/35  lung]
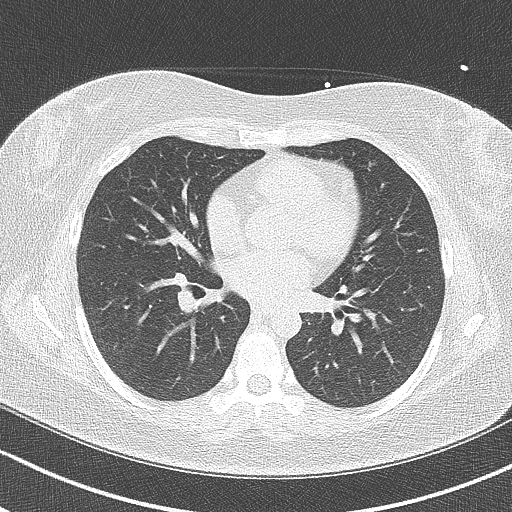
[im 29/35  lung]
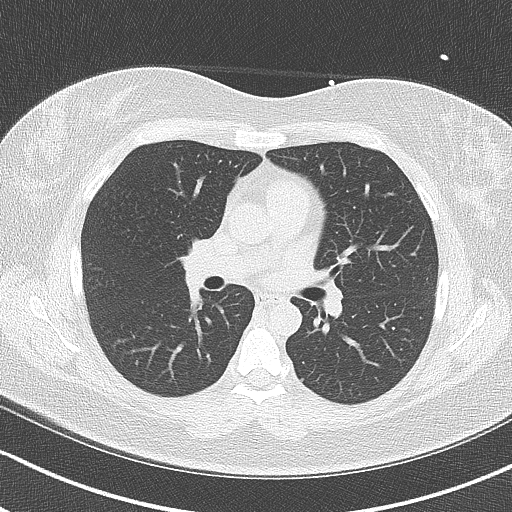

[Series 4: lung st 72 % · axial · 0.65mm/px · z∈[-202,-134]mm · 5 of 35 slices shown]
[im 6/35  lung]
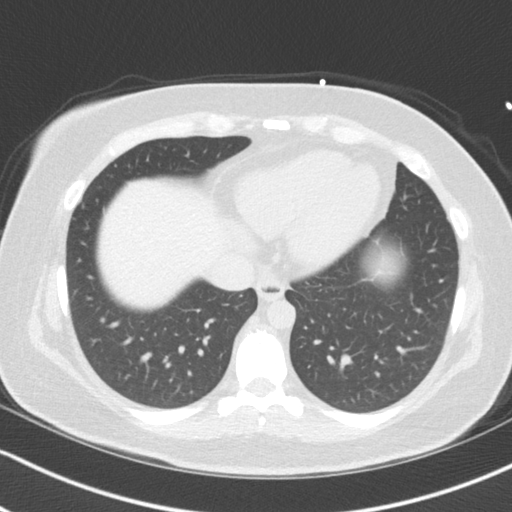
[im 12/35  lung]
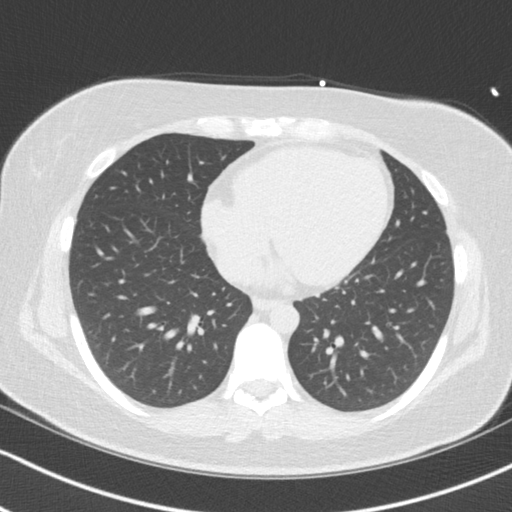
[im 18/35  lung]
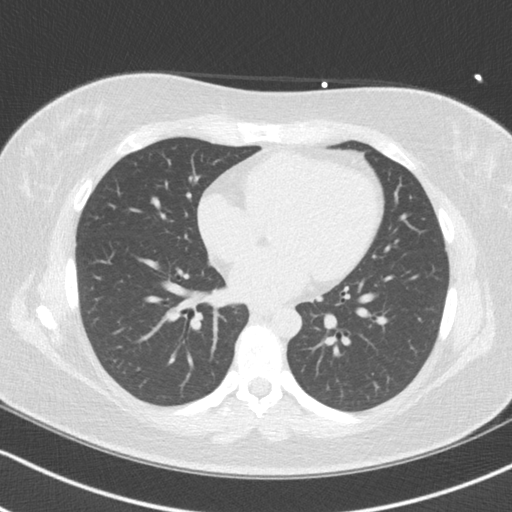
[im 23/35  lung]
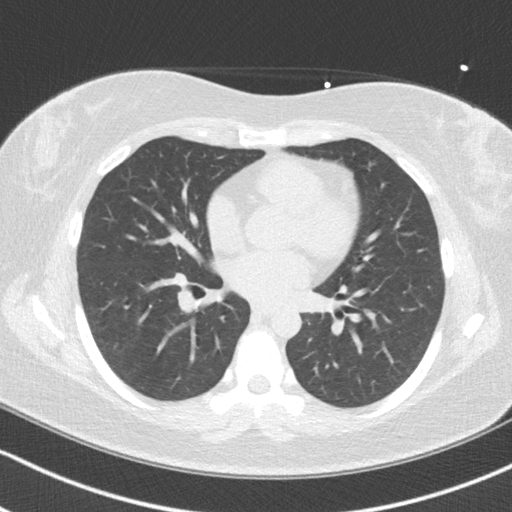
[im 29/35  lung]
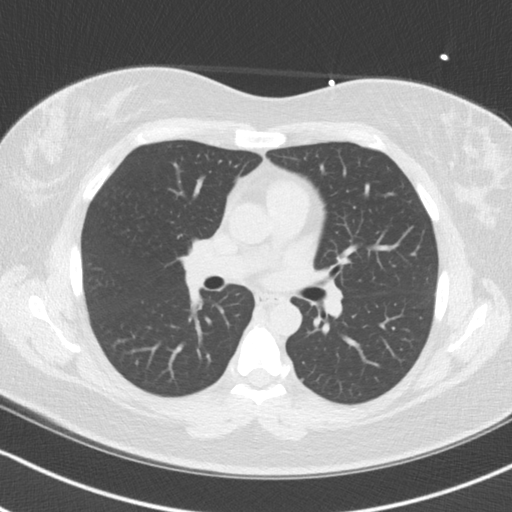

[14 of 20 positions shown; findings below may reference images not displayed]

FINDINGS: Within the visualized portions of the thorax there are no suspicious
appearing pulmonary nodules or masses, there is no acute
consolidative airspace disease, no pleural effusions, no
pneumothorax and no lymphadenopathy. Visualized portions of the
upper abdomen are unremarkable. There are no aggressive appearing
lytic or blastic lesions noted in the visualized portions of the
skeleton.
IMPRESSION: No significant incidental noncardiac findings are noted.
FINDINGS: Non-cardiac: See separate report from [REDACTED].

Ascending Aorta: Normal caliber

Pericardium: Normal

Coronary arteries: Normal origins
IMPRESSION: Coronary calcium score of 0.

*** End of Addendum ***
EXAM:
OVER-READ INTERPRETATION  CT CHEST

The following report is an over-read performed by radiologist Dr.
Miumiu Doronio [REDACTED] on 06/06/2020. This
over-read does not include interpretation of cardiac or coronary
anatomy or pathology. The coronary calcium score/coronary CTA
interpretation by the cardiologist is attached.
FINDINGS: Within the visualized portions of the thorax there are no suspicious
appearing pulmonary nodules or masses, there is no acute
consolidative airspace disease, no pleural effusions, no
pneumothorax and no lymphadenopathy. Visualized portions of the
upper abdomen are unremarkable. There are no aggressive appearing
lytic or blastic lesions noted in the visualized portions of the
skeleton.
IMPRESSION: No significant incidental noncardiac findings are noted.

## 2021-11-14 ENCOUNTER — Ambulatory Visit: Payer: BC Managed Care – PPO

## 2021-11-20 ENCOUNTER — Ambulatory Visit (INDEPENDENT_AMBULATORY_CARE_PROVIDER_SITE_OTHER): Payer: BC Managed Care – PPO | Admitting: *Deleted

## 2021-11-20 DIAGNOSIS — L501 Idiopathic urticaria: Secondary | ICD-10-CM

## 2021-11-22 ENCOUNTER — Ambulatory Visit (INDEPENDENT_AMBULATORY_CARE_PROVIDER_SITE_OTHER): Payer: BC Managed Care – PPO

## 2021-11-22 DIAGNOSIS — Z23 Encounter for immunization: Secondary | ICD-10-CM | POA: Diagnosis not present

## 2021-11-22 NOTE — Progress Notes (Signed)
41 yo female here today for Hep A vaccine  Pt was given 1 ml of Havrix in left deltoid. Pt tolerated well

## 2021-12-16 ENCOUNTER — Other Ambulatory Visit: Payer: Self-pay | Admitting: Allergy

## 2021-12-18 ENCOUNTER — Other Ambulatory Visit: Payer: Self-pay

## 2021-12-18 ENCOUNTER — Ambulatory Visit (INDEPENDENT_AMBULATORY_CARE_PROVIDER_SITE_OTHER): Payer: BC Managed Care – PPO | Admitting: *Deleted

## 2021-12-18 DIAGNOSIS — L501 Idiopathic urticaria: Secondary | ICD-10-CM | POA: Diagnosis not present

## 2022-01-15 ENCOUNTER — Ambulatory Visit: Payer: BC Managed Care – PPO

## 2022-01-25 ENCOUNTER — Ambulatory Visit (INDEPENDENT_AMBULATORY_CARE_PROVIDER_SITE_OTHER): Payer: BC Managed Care – PPO

## 2022-01-25 DIAGNOSIS — L501 Idiopathic urticaria: Secondary | ICD-10-CM

## 2022-02-26 ENCOUNTER — Ambulatory Visit (INDEPENDENT_AMBULATORY_CARE_PROVIDER_SITE_OTHER): Payer: BC Managed Care – PPO

## 2022-02-26 DIAGNOSIS — L501 Idiopathic urticaria: Secondary | ICD-10-CM | POA: Diagnosis not present

## 2022-03-02 ENCOUNTER — Encounter: Payer: Self-pay | Admitting: Family Medicine

## 2022-03-02 ENCOUNTER — Ambulatory Visit: Payer: BC Managed Care – PPO | Admitting: Family Medicine

## 2022-03-02 VITALS — BP 108/60 | HR 82 | Temp 98.0°F | Ht 64.0 in | Wt 202.5 lb

## 2022-03-02 DIAGNOSIS — F411 Generalized anxiety disorder: Secondary | ICD-10-CM | POA: Diagnosis not present

## 2022-03-02 DIAGNOSIS — F321 Major depressive disorder, single episode, moderate: Secondary | ICD-10-CM | POA: Diagnosis not present

## 2022-03-02 MED ORDER — HYDROXYZINE HCL 25 MG PO TABS: 12.5000 mg | ORAL_TABLET | Freq: Three times a day (TID) | ORAL | 2 refills | Status: DC | PRN

## 2022-03-02 MED ORDER — ESCITALOPRAM OXALATE 10 MG PO TABS: 10.0000 mg | ORAL_TABLET | Freq: Every day | ORAL | 2 refills | Status: DC

## 2022-03-02 NOTE — Patient Instructions (Addendum)
Don't take the Benadryl for sleep while on this new medicine combo.  Take 1/2 tab daily for 2 weeks of the Lexapro and then bump up to a full tab.   Please consider counseling. Contact [phone] to schedule an appointment or inquire about cost/insurance coverage.  [facility] located at [address], [place], [place].  Phone number = [phone].  Dr. Regan Lemming - Adult Psychiatry.    [facility] located at [address] [place], [place], [place]. Phone number = [phone].   [facility] located at [address] [place], [place].  Phone number = [phone].   [facility] located at [address] [place], [place], [place].  Phone number = [phone].  Aim to do some physical exertion for 150 minutes per week. This is typically divided into 5 days per week, 30 minutes per day. The activity should be enough to get your heart rate up. Anything is better than nothing if you have time constraints.  Coping skills Choose 5 that work for you: Take a deep breath Count to 20 Read a book Do a puzzle Meditate Bake Sing Knit Garden Pray Go outside Call a friend Listen to music Take a walk Color Send a note Take a bath Watch a movie Be alone in a quiet place Pet an animal Visit a friend Journal Exercise Stretch   Let us know if you need anything.

## 2022-03-02 NOTE — Progress Notes (Signed)
Chief Complaint  Patient presents with   Insomnia    Fatigue    Anxiety    Stress     Subjective Lori Williamson is an 41 y.o. female who presents with anxiety/depression Symptoms began in adolescence but worsened 2 months ago.  Her husband is recovering from surgery and she was recently told her responsibilities at work will increase. Anxiety symptoms: chest pain, difficulty concentrating, fatigue, insomnia, irritable, palpitations, racing thoughts. Depressive symptoms depressed mood, anhedonia, insomnia, feelings of worthlessness/guilt, difficulty concentrating, hopelessness, impaired memory, suicidal thoughts without plan, panic attacks,. She is not currently on any prescription medication and has never been on any before.  She does not follow with a counselor or psychologist currently.  She did in the past through her job and had poor experience.  Past Medical History:  Diagnosis Date   Allergic rhinitis    Hypercholesterolemia    Urticaria      Family History Family History  Problem Relation Age of Onset   Diabetes Mother    Hypertension Mother    Hypercholesterolemia Mother    Arthritis Mother    Allergic rhinitis Father    Allergic rhinitis Brother    Hypercholesterolemia Brother    Angioedema Neg Hx    Asthma Neg Hx    Eczema Neg Hx    Immunodeficiency Neg Hx    Urticaria Neg Hx     Exam BP 108/60   Pulse 82   Temp 98 F (36.7 C) (Oral)   Ht 5\' 4"  (1.626 m)   Wt 202 lb 8 oz (91.9 kg)   SpO2 99%   BMI 34.76 kg/m  General:  well developed, well nourished, in no apparent distress Heart: RRR Lungs:  CTAB. normal respiratory effort without accessory muscle use Psych: well oriented with normal range of affect and age-appropriate judgement/insight  Assessment and Plan  GAD (generalized anxiety disorder) - Plan: escitalopram (LEXAPRO) 10 MG tablet, hydrOXYzine (ATARAX) 25 MG tablet  Depression, major, single episode, moderate (HCC) - Plan:  escitalopram (LEXAPRO) 10 MG tablet  Exacerbation of chronic issue.  She is now open to the idea of medicine given the severity of her symptoms.  Start Lexapro 5 mg daily for 2 weeks and then increase to 10 mg daily.  Hydroxyzine 12.5 to 75 mg every 8 hours as needed for anxiety/panic.  Counseled on adjunctive treatment with exercise/physical activity. Number for Counseling provided in AVS. We did discuss suicidal thoughts.  She does have husband and son and things to live for.  Her husband is a and is aware of everything.  [facility] psychiatric urgent care and [facility] recommended if things get worse with these thoughts.  She has no active plan. F/u in 6 weeks. Patient voiced understanding and agreement to the plan.  [facility] [place], DO 03/02/22 8:32 AM

## 2022-03-26 ENCOUNTER — Ambulatory Visit (INDEPENDENT_AMBULATORY_CARE_PROVIDER_SITE_OTHER): Payer: BC Managed Care – PPO

## 2022-03-26 DIAGNOSIS — L501 Idiopathic urticaria: Secondary | ICD-10-CM | POA: Diagnosis not present

## 2022-04-13 ENCOUNTER — Ambulatory Visit: Payer: BC Managed Care – PPO | Admitting: Family Medicine

## 2022-04-20 ENCOUNTER — Ambulatory Visit: Payer: BC Managed Care – PPO | Admitting: Family Medicine

## 2022-04-23 ENCOUNTER — Ambulatory Visit (INDEPENDENT_AMBULATORY_CARE_PROVIDER_SITE_OTHER): Payer: BC Managed Care – PPO

## 2022-04-23 DIAGNOSIS — L501 Idiopathic urticaria: Secondary | ICD-10-CM

## 2022-04-30 ENCOUNTER — Encounter: Payer: Self-pay | Admitting: Allergy and Immunology

## 2022-04-30 ENCOUNTER — Ambulatory Visit: Payer: BC Managed Care – PPO | Admitting: Allergy and Immunology

## 2022-04-30 VITALS — BP 112/80 | HR 78 | Resp 16 | Ht 63.4 in | Wt 210.0 lb

## 2022-04-30 DIAGNOSIS — L501 Idiopathic urticaria: Secondary | ICD-10-CM

## 2022-04-30 DIAGNOSIS — J310 Chronic rhinitis: Secondary | ICD-10-CM | POA: Diagnosis not present

## 2022-04-30 NOTE — Patient Instructions (Signed)
  1.  Continue on the omalizumab every 4 weeks.  Can attempt to stretch out interval to every 8 weeks.  2.  Continue OTC antihistamine if needed  3.  Continue EpiPen if needed  4.  Obtain fall flu vaccine  5.  Return to clinic in 1 year or earlier if problem

## 2022-04-30 NOTE — Progress Notes (Unsigned)
[facility] [address] [place] [facility] [place] [facility] [place] [facility] [place]   Follow-up Note  Referring Provider: Sharlene Dory* Primary Provider: Sharlene Dory, DO Date of Office Visit: 04/30/2022  Subjective:   Lori Williamson (DOB: [birth year]) is a 41 y.o. female who [facility] in re-evaluation of the following:  HPI: Lori Williamson returns to this clinic in evaluation of chronic urticaria and allergic rhinitis.  I have never seen her in this clinic and her last visit with Dr. Delorse Lek was 27 March 2021.  While utilizing the omalizumab every 4 weeks she has had excellent control of her urticaria and rarely requires any additional antihistamines.  Likewise, her upper airway issue is under excellent control with rare use of any antihistamines if she goes through each season of the year.  She has not had any adverse effect from using omalizumab.  Allergies as of 04/30/2022       Reactions   Sulfa Antibiotics Nausea And Vomiting        Medication List    cetirizine 10 MG tablet Commonly known as: ZYRTEC Take 10 mg by mouth daily. Every evening   EpiPen 2-Pak 0.3 mg/0.3 mL Soaj injection Generic drug: EPINEPHrine Inject 0.3 mg into the muscle as needed for anaphylaxis.   escitalopram 10 MG tablet Commonly known as: Lexapro Take 1 tablet (10 mg total) by mouth daily.   famotidine 20 MG tablet Commonly known as: Pepcid Take 1 tablet (20 mg total) by mouth 2 (two) times daily.   fluticasone 50 MCG/ACT nasal spray Commonly known as: FLONASE Place 1-2 sprays into both nostrils daily as needed for allergies.   hydrOXYzine 25 MG tablet Commonly known as: ATARAX Take 0.5-3 tablets (12.5-75 mg total) by mouth 3 (three) times daily as needed for anxiety.   lansoprazole 15 MG capsule Commonly known as: PREVACID Take 15 mg by mouth daily at 12 noon.   meclizine 25 MG tablet Commonly known as: ANTIVERT Take 1 tablet (25 mg total)  by mouth 3 (three) times daily as needed for dizziness.   Xolair 150 MG/ML prefilled syringe Generic drug: omalizumab INJECT 2 SYRINGES (300 MG) UNDER THE SKIN EVERY 28 DAYS    Past Medical History:  Diagnosis Date   Allergic rhinitis    Hypercholesterolemia    Urticaria     Past Surgical History:  Procedure Laterality Date   ARTHROGRAM Right 01/2020   CHOLECYSTECTOMY  2009    Review of systems negative except as noted in HPI / PMHx or noted below:  Review of Systems  Constitutional: Negative.   HENT: Negative.    Eyes: Negative.   Respiratory: Negative.    Cardiovascular: Negative.   Gastrointestinal: Negative.   Genitourinary: Negative.   Musculoskeletal: Negative.   Skin: Negative.   Neurological: Negative.   Endo/Heme/Allergies: Negative.   Psychiatric/Behavioral: Negative.       Objective:   Vitals:   04/30/22 0816  BP: 112/80  Pulse: 78  Resp: 16  SpO2: 98%   Height: 5' 3.4" (161 cm)  Weight: 210 lb (95.3 kg)   Physical Exam Constitutional:      Appearance: She is not diaphoretic.  HENT:     Head: Normocephalic.     Right Ear: Tympanic membrane, ear canal and external ear normal.     Left Ear: Tympanic membrane, ear canal and external ear normal.     Nose: Nose normal. No mucosal edema or rhinorrhea.     Mouth/Throat:  Pharynx: Uvula midline. No oropharyngeal exudate.  Eyes:     Conjunctiva/sclera: Conjunctivae normal.  Neck:     Thyroid: No thyromegaly.     Trachea: Trachea normal. No tracheal tenderness or tracheal deviation.  Cardiovascular:     Rate and Rhythm: Normal rate and regular rhythm.     Heart sounds: Normal heart sounds, S1 normal and S2 normal. No murmur heard. Pulmonary:     Effort: No respiratory distress.     Breath sounds: Normal breath sounds. No stridor. No wheezing or rales.  Lymphadenopathy:     Head:     Right side of head: No tonsillar adenopathy.     Left side of head: No tonsillar adenopathy.     Cervical:  No cervical adenopathy.  Skin:    Findings: No erythema or rash.     Nails: There is no clubbing.  Neurological:     Mental Status: She is alert.     Diagnostics: none  Assessment and Plan:   1. Idiopathic urticaria   2. Other rhinitis    1.  Continue on the omalizumab every 4 weeks.  Can attempt to stretch out interval to every 8 weeks.  2.  Continue OTC antihistamine if needed  3.  Continue EpiPen if needed  4.  Obtain fall flu vaccine  5.  Return to clinic in 1 year or earlier if problem  Lori Williamson is doing very well using on the omalizumab to control her chronic urticaria and in addition her rhinitis.  She has the option of increasing the interval of administration of omalizumab from every 4 weeks to every 8 weeks.  Assuming she does well with this plan I will see her back in this clinic in 1 year or earlier if there is a problem.  Lori Schimke, MD Allergy / Immunology [facility]

## 2022-05-01 ENCOUNTER — Encounter: Payer: Self-pay | Admitting: Allergy and Immunology

## 2022-05-04 ENCOUNTER — Ambulatory Visit: Payer: BC Managed Care – PPO | Admitting: Family Medicine

## 2022-05-04 ENCOUNTER — Encounter: Payer: Self-pay | Admitting: Family Medicine

## 2022-05-04 VITALS — BP 112/70 | HR 80 | Temp 98.0°F | Ht 64.0 in | Wt 211.4 lb

## 2022-05-04 DIAGNOSIS — F321 Major depressive disorder, single episode, moderate: Secondary | ICD-10-CM

## 2022-05-04 DIAGNOSIS — F411 Generalized anxiety disorder: Secondary | ICD-10-CM | POA: Diagnosis not present

## 2022-05-04 MED ORDER — ESCITALOPRAM OXALATE 10 MG PO TABS: 10.0000 mg | ORAL_TABLET | Freq: Every day | ORAL | 2 refills | Status: DC

## 2022-05-04 MED ORDER — HYDROXYZINE HCL 25 MG PO TABS: 12.5000 mg | ORAL_TABLET | Freq: Three times a day (TID) | ORAL | 2 refills | Status: AC | PRN

## 2022-05-04 NOTE — Progress Notes (Signed)
Chief Complaint  Patient presents with   Follow-up    Needs refills today     Subjective Lori Williamson presents for f/u anxiety/depression.  Pt is currently being treated with Lexapro 10 mg mg/d. She takes Atarax as needed.  Reports 20-30% improved since treatment. Reports compliance, might be eating more on the medication.  No thoughts of harming self or others. No self-medication with alcohol, prescription drugs or illicit drugs. Pt is not following with a counselor/psychologist.  Past Medical History:  Diagnosis Date   Allergic rhinitis    Hypercholesterolemia    Urticaria    Allergies as of 05/04/2022       Reactions   Sulfa Antibiotics Nausea And Vomiting        Medication List        Accurate as of May 04, 2022  4:09 PM. If you have any questions, ask your nurse or doctor.          cetirizine 10 MG tablet Commonly known as: ZYRTEC Take 10 mg by mouth daily. Every evening   EpiPen 2-Pak 0.3 mg/0.3 mL Soaj injection Generic drug: EPINEPHrine Inject 0.3 mg into the muscle as needed for anaphylaxis.   escitalopram 10 MG tablet Commonly known as: Lexapro Take 1 tablet (10 mg total) by mouth daily.   famotidine 20 MG tablet Commonly known as: Pepcid Take 1 tablet (20 mg total) by mouth 2 (two) times daily.   fluticasone 50 MCG/ACT nasal spray Commonly known as: FLONASE Place 1-2 sprays into both nostrils daily as needed for allergies.   hydrOXYzine 25 MG tablet Commonly known as: ATARAX Take 0.5-3 tablets (12.5-75 mg total) by mouth 3 (three) times daily as needed for anxiety.   lansoprazole 15 MG capsule Commonly known as: PREVACID Take 15 mg by mouth daily at 12 noon.   meclizine 25 MG tablet Commonly known as: ANTIVERT Take 1 tablet (25 mg total) by mouth 3 (three) times daily as needed for dizziness.   Xolair 150 MG/ML prefilled syringe Generic drug: omalizumab INJECT 2 SYRINGES (300 MG) UNDER THE SKIN EVERY 28 DAYS         Exam BP 112/70   Pulse 80   Temp 98 F (36.7 C) (Oral)   Ht 5\' 4"  (1.626 m)   Wt 211 lb 6 oz (95.9 kg)   SpO2 98%   BMI 36.28 kg/m  General:  well developed, well nourished, in no apparent distress Lungs:  No respiratory distress Psych: well oriented with normal range of affect and age-appropriate judgement/insight, alert and oriented x4.  Assessment and Plan  GAD (generalized anxiety disorder) - Plan: escitalopram (LEXAPRO) 10 MG tablet, hydrOXYzine (ATARAX) 25 MG tablet  Depression, major, single episode, moderate (HCC) - Plan: escitalopram (LEXAPRO) 10 MG tablet  Chronic, stable. Cont Lexapro 10 mg/d, hydroxyzine as needed.  F/u for CPE at convenience. The patient voiced understanding and agreement to the plan.  [place], DO 05/04/22 4:09 PM

## 2022-05-04 NOTE — Patient Instructions (Addendum)
Take Metamucil or Benefiber daily.  Stay hydrated.   Aim to do some physical exertion for 150 minutes per week. This is typically divided into 5 days per week, 30 minutes per day. The activity should be enough to get your heart rate up. Anything is better than nothing if you have time constraints.  Let us know if you need anything.

## 2022-05-21 ENCOUNTER — Ambulatory Visit: Payer: BC Managed Care – PPO

## 2022-05-25 ENCOUNTER — Ambulatory Visit (INDEPENDENT_AMBULATORY_CARE_PROVIDER_SITE_OTHER): Payer: BC Managed Care – PPO | Admitting: *Deleted

## 2022-05-25 DIAGNOSIS — L501 Idiopathic urticaria: Secondary | ICD-10-CM

## 2022-05-28 ENCOUNTER — Encounter: Payer: BC Managed Care – PPO | Admitting: Family Medicine

## 2022-06-01 ENCOUNTER — Encounter: Payer: Self-pay | Admitting: Family Medicine

## 2022-06-01 ENCOUNTER — Ambulatory Visit (INDEPENDENT_AMBULATORY_CARE_PROVIDER_SITE_OTHER): Payer: BC Managed Care – PPO | Admitting: Family Medicine

## 2022-06-01 VITALS — BP 120/72 | HR 66 | Temp 98.2°F | Ht 64.0 in | Wt 209.4 lb

## 2022-06-01 DIAGNOSIS — Z0000 Encounter for general adult medical examination without abnormal findings: Secondary | ICD-10-CM

## 2022-06-01 LAB — COMPREHENSIVE METABOLIC PANEL
ALT: 10 U/L (ref 0–35)
AST: 14 U/L (ref 0–37)
Albumin: 4.1 g/dL (ref 3.5–5.2)
Alkaline Phosphatase: 49 U/L (ref 39–117)
BUN: 14 mg/dL (ref 6–23)
CO2: 28 mEq/L (ref 19–32)
Calcium: 8.8 mg/dL (ref 8.4–10.5)
Chloride: 105 mEq/L (ref 96–112)
Creatinine, Ser: 0.97 mg/dL (ref 0.40–1.20)
GFR: 73.09 mL/min (ref >60.00)
Glucose, Bld: 79 mg/dL (ref 70–99)
Potassium: 4.4 mEq/L (ref 3.5–5.1)
Sodium: 139 mEq/L (ref 135–145)
Total Bilirubin: 0.6 mg/dL (ref 0.2–1.2)
Total Protein: 6.3 g/dL (ref 6.0–8.3)

## 2022-06-01 LAB — CBC
HCT: 40.2 % (ref 36.0–46.0)
Hemoglobin: 13.4 g/dL (ref 12.0–15.0)
MCHC: 33.4 g/dL (ref 30.0–36.0)
MCV: 92.1 fl (ref 78.0–100.0)
Platelets: 282 10*3/uL (ref 150.0–400.0)
RBC: 4.36 Mil/uL (ref 3.87–5.11)
RDW: 12.5 % (ref 11.5–15.5)
WBC: 5.6 10*3/uL (ref 4.0–10.5)

## 2022-06-01 LAB — LIPID PANEL
Cholesterol: 250 mg/dL — ABNORMAL HIGH (ref 0–200)
HDL: 49.5 mg/dL (ref >39.00)
LDL Cholesterol: 182 mg/dL — ABNORMAL HIGH (ref 0–99)
NonHDL: 200.56
Total CHOL/HDL Ratio: 5
Triglycerides: 95 mg/dL (ref 0.0–149.0)
VLDL: 19 mg/dL (ref 0.0–40.0)

## 2022-06-01 NOTE — Progress Notes (Signed)
Chief Complaint  Patient presents with   Annual Exam     Well Woman Lori Williamson is here for a complete physical.   Her last physical was >1 year ago.  Current diet: in general, a "horrible" diet. Current exercise: nothing. Weight is stable and she denies fatigue out of ordinary. Seatbelt? Yes Advanced directive? Yes  Health Maintenance Pap/HPV- Due Mammogram- not done yet Tetanus- Yes Hep C screening- Yes HIV screening- Yes  Past Medical History:  Diagnosis Date   Allergic rhinitis    Hypercholesterolemia    Urticaria      Past Surgical History:  Procedure Laterality Date   ARTHROGRAM Right 01/2020   CHOLECYSTECTOMY  2009    Medications  Current Outpatient Medications on File Prior to Visit  Medication Sig Dispense Refill   cetirizine (ZYRTEC) 10 MG tablet Take 10 mg by mouth daily. Every evening     EPINEPHrine (EPIPEN 2-PAK) 0.3 mg/0.3 mL IJ SOAJ injection Inject 0.3 mg into the muscle as needed for anaphylaxis.     escitalopram (LEXAPRO) 10 MG tablet Take 1 tablet (10 mg total) by mouth daily. 90 tablet 2   famotidine (PEPCID) 20 MG tablet Take 1 tablet (20 mg total) by mouth 2 (two) times daily. 60 tablet 5   fluticasone (FLONASE) 50 MCG/ACT nasal spray Place 1-2 sprays into both nostrils daily as needed for allergies. 16 mL 5   hydrOXYzine (ATARAX) 25 MG tablet Take 0.5-3 tablets (12.5-75 mg total) by mouth 3 (three) times daily as needed for anxiety. 60 tablet 2   lansoprazole (PREVACID) 15 MG capsule Take 15 mg by mouth daily at 12 noon.     meclizine (ANTIVERT) 25 MG tablet Take 1 tablet (25 mg total) by mouth 3 (three) times daily as needed for dizziness. 90 tablet 0   XOLAIR 150 MG/ML prefilled syringe INJECT 2 SYRINGES (300 MG) UNDER THE SKIN EVERY 28 DAYS 2 mL 11    Allergies Allergies  Allergen Reactions   Sulfa Antibiotics Nausea And Vomiting    Review of Systems: Constitutional:  no unexpected weight changes Eye:  no recent significant  change in vision Ear/Nose/Mouth/Throat:  Ears:  no recent change in hearing Nose/Mouth/Throat:  no complaints of nasal congestion, no sore throat Cardiovascular: no chest pain Respiratory:  no shortness of breath Gastrointestinal:  no abdominal pain, no change in bowel habits GU:  Female: negative for dysuria or pelvic pain Musculoskeletal/Extremities:  no pain of the joints Integumentary (Skin/Breast):  no abnormal skin lesions reported Neurologic:  no headaches Endocrine:  denies fatigue Hematologic/Lymphatic:  No areas of easy bleeding  Exam BP 120/72   Pulse 66   Temp 98.2 F (36.8 C) (Oral)   Ht 5\' 4"  (1.626 m)   Wt 209 lb 6 oz (95 kg)   SpO2 98%   BMI 35.94 kg/m  General:  well developed, well nourished, in no apparent distress Skin:  no significant moles, warts, or growths Head:  no masses, lesions, or tenderness Eyes:  pupils equal and round, sclera anicteric without injection Ears:  canals without lesions, TMs shiny without retraction, no obvious effusion, no erythema Nose:  nares patent, septum midline, mucosa normal, and no drainage or sinus tenderness Throat/Pharynx:  lips and gingiva without lesion; tongue and uvula midline; non-inflamed pharynx; no exudates or postnasal drainage Neck: neck supple without adenopathy, thyromegaly, or masses Lungs:  clear to auscultation, breath sounds equal bilaterally, no respiratory distress Cardio:  regular rate and rhythm, no LE edema Abdomen:  abdomen soft,  nontender; bowel sounds normal; no masses or organomegaly Genital: Defer to GYN Musculoskeletal:  symmetrical muscle groups noted without atrophy or deformity Extremities:  no clubbing, cyanosis, or edema, no deformities, no skin discoloration Neuro:  gait normal; deep tendon reflexes normal and symmetric Psych: well oriented with normal range of affect and appropriate judgment/insight  Assessment and Plan  Well adult exam - Plan: CBC, Comprehensive metabolic panel, Lipid  panel   Well 41 y.o. female. Counseled on diet and exercise. Advanced directive form requested today.  She has contact info for GYN team. Her schedule has become less hectic, will have more opportunity to do this. She will reach out soon. We also discussed mammograms and the differing recommendations between the ACR, ACOG and AAFP/USPSTF. Agreed to hold off on ordering this for now.  Other orders as above. Follow up in 6 mo or prn. The patient voiced understanding and agreement to the plan.  Jilda Roche [place], DO 06/01/22 7:30 AM

## 2022-06-01 NOTE — Patient Instructions (Addendum)
Give [place] 2-3 business days to get the results of your labs back.   Keep the diet clean and stay active.  Aim to do some physical exertion for 150 minutes per week. This is typically divided into 5 days per week, 30 minutes per day. The activity should be enough to get your heart rate up. Anything is better than nothing if you have time constraints.  I recommend getting the flu shot in mid October. This suggestion would change if the CDC comes out with a different recommendation.   Let us know if you need anything.

## 2022-07-02 ENCOUNTER — Ambulatory Visit (INDEPENDENT_AMBULATORY_CARE_PROVIDER_SITE_OTHER): Payer: BC Managed Care – PPO

## 2022-07-02 DIAGNOSIS — L501 Idiopathic urticaria: Secondary | ICD-10-CM | POA: Diagnosis not present

## 2022-07-09 ENCOUNTER — Ambulatory Visit: Payer: BC Managed Care – PPO | Admitting: Internal Medicine

## 2022-08-13 ENCOUNTER — Ambulatory Visit (INDEPENDENT_AMBULATORY_CARE_PROVIDER_SITE_OTHER): Payer: BC Managed Care – PPO

## 2022-08-13 DIAGNOSIS — L501 Idiopathic urticaria: Secondary | ICD-10-CM | POA: Diagnosis not present

## 2022-09-24 ENCOUNTER — Ambulatory Visit (INDEPENDENT_AMBULATORY_CARE_PROVIDER_SITE_OTHER): Payer: BC Managed Care – PPO | Admitting: *Deleted

## 2022-09-24 DIAGNOSIS — L501 Idiopathic urticaria: Secondary | ICD-10-CM

## 2022-10-31 ENCOUNTER — Other Ambulatory Visit: Payer: Self-pay | Admitting: Allergy

## 2022-11-05 ENCOUNTER — Ambulatory Visit (INDEPENDENT_AMBULATORY_CARE_PROVIDER_SITE_OTHER): Payer: BC Managed Care – PPO

## 2022-11-05 DIAGNOSIS — L501 Idiopathic urticaria: Secondary | ICD-10-CM

## 2022-12-07 ENCOUNTER — Ambulatory Visit: Payer: BC Managed Care – PPO | Admitting: Family Medicine

## 2022-12-07 ENCOUNTER — Encounter: Payer: Self-pay | Admitting: Family Medicine

## 2022-12-07 DIAGNOSIS — F411 Generalized anxiety disorder: Secondary | ICD-10-CM | POA: Diagnosis not present

## 2022-12-07 DIAGNOSIS — F321 Major depressive disorder, single episode, moderate: Secondary | ICD-10-CM | POA: Diagnosis not present

## 2022-12-07 MED ORDER — ESCITALOPRAM OXALATE 10 MG PO TABS: 10.0000 mg | ORAL_TABLET | Freq: Every day | ORAL | 3 refills | Status: DC

## 2022-12-07 NOTE — Progress Notes (Signed)
Chief Complaint  Patient presents with   Follow-up    Lexapro is working great     Subjective Lori Williamson presents for f/u anxiety/depression.  Pt is currently being treated with Lexapro 10 mg/d.  Reports doing very well since treatment. No thoughts of harming self or others. No self-medication with alcohol, prescription drugs or illicit drugs. Pt is not following with a counselor/psychologist.  Past Medical History:  Diagnosis Date   Allergic rhinitis    Hypercholesterolemia    Urticaria    Allergies as of 12/07/2022       Reactions   Sulfa Antibiotics Nausea And Vomiting        Medication List        Accurate as of December 07, 2022  7:13 AM. If you have any questions, ask your nurse or doctor.          cetirizine 10 MG tablet Commonly known as: ZYRTEC Take 10 mg by mouth daily. Every evening   EpiPen 2-Pak 0.3 mg/0.3 mL Soaj injection Generic drug: EPINEPHrine Inject 0.3 mg into the muscle as needed for anaphylaxis.   escitalopram 10 MG tablet Commonly known as: Lexapro Take 1 tablet (10 mg total) by mouth daily.   famotidine 20 MG tablet Commonly known as: Pepcid Take 1 tablet (20 mg total) by mouth 2 (two) times daily.   fluticasone 50 MCG/ACT nasal spray Commonly known as: FLONASE Place 1-2 sprays into both nostrils daily as needed for allergies.   hydrOXYzine 25 MG tablet Commonly known as: ATARAX Take 0.5-3 tablets (12.5-75 mg total) by mouth 3 (three) times daily as needed for anxiety.   lansoprazole 15 MG capsule Commonly known as: PREVACID Take 15 mg by mouth daily at 12 noon.   meclizine 25 MG tablet Commonly known as: ANTIVERT Take 1 tablet (25 mg total) by mouth 3 (three) times daily as needed for dizziness.   Xolair 150 MG/ML prefilled syringe Generic drug: omalizumab INJECT THE CONTENTS OF 2 SYRINGES (300 MG) UNDER THE SKIN EVERY 28 DAYS        Exam BP 118/70 (BP Location: Left Arm, Patient Position: Sitting, Cuff  Size: Large)   Pulse 76   Temp (!) 97.5 F (36.4 C) (Oral)   Ht '5\' 4"'$  (1.626 m)   Wt 215 lb (97.5 kg)   SpO2 97%   BMI 36.90 kg/m  General:  well developed, well nourished, in no apparent distress Lungs:  No respiratory distress Psych: well oriented with normal range of affect and age-appropriate judgement/insight, alert and oriented x4.  Assessment and Plan  GAD (generalized anxiety disorder) - Plan: escitalopram (LEXAPRO) 10 MG tablet  Depression, major, single episode, moderate (HCC) - Plan: escitalopram (LEXAPRO) 10 MG tablet  Chronic, stable. Cont Lexapro 10 mg/d. Counseled on exercise.  Rec'd she get her pelvic exam. GYN info provided.  F/u in 6 mo. The patient voiced understanding and agreement to the plan.  [place], DO 12/07/22 7:13 AM

## 2022-12-07 NOTE — Patient Instructions (Addendum)
Stay active.   Aim to do some physical exertion for 150 minutes per week. This is typically divided into 5 days per week, 30 minutes per day. The activity should be enough to get your heart rate up. Anything is better than nothing if you have time constraints.  Call Center for [place] at [facility] at [phone] for an appointment.  They are located at [address], [address], [place], [place], [address] (right across the hall from our office).  Let us know if you need anything.

## 2022-12-17 ENCOUNTER — Ambulatory Visit (INDEPENDENT_AMBULATORY_CARE_PROVIDER_SITE_OTHER): Payer: BC Managed Care – PPO

## 2022-12-17 ENCOUNTER — Ambulatory Visit: Payer: BC Managed Care – PPO

## 2022-12-17 DIAGNOSIS — L501 Idiopathic urticaria: Secondary | ICD-10-CM

## 2023-01-28 ENCOUNTER — Ambulatory Visit: Payer: BC Managed Care – PPO

## 2023-01-28 ENCOUNTER — Ambulatory Visit (INDEPENDENT_AMBULATORY_CARE_PROVIDER_SITE_OTHER): Payer: BC Managed Care – PPO

## 2023-01-28 DIAGNOSIS — L501 Idiopathic urticaria: Secondary | ICD-10-CM

## 2023-03-11 ENCOUNTER — Ambulatory Visit (INDEPENDENT_AMBULATORY_CARE_PROVIDER_SITE_OTHER): Payer: BC Managed Care – PPO

## 2023-03-11 DIAGNOSIS — L501 Idiopathic urticaria: Secondary | ICD-10-CM

## 2023-04-02 ENCOUNTER — Telehealth: Payer: Self-pay | Admitting: Family Medicine

## 2023-04-02 DIAGNOSIS — F321 Major depressive disorder, single episode, moderate: Secondary | ICD-10-CM

## 2023-04-02 DIAGNOSIS — F411 Generalized anxiety disorder: Secondary | ICD-10-CM

## 2023-04-02 MED ORDER — ESCITALOPRAM OXALATE 10 MG PO TABS: 10.0000 mg | ORAL_TABLET | Freq: Every day | ORAL | 3 refills | Status: DC

## 2023-04-02 NOTE — Telephone Encounter (Signed)
Sent in and patient informed 

## 2023-04-02 NOTE — Telephone Encounter (Signed)
Pt states the pharmacy is telling her she has no refills remaining and the written prescription on our end does not indicate she is out of refills. Please advise if she is supposed to call our office first before the pharmacy fills rx.   Medication: escitalopram (LEXAPRO) 10 MG tablet  Has the patient contacted their pharmacy? Yes.     Preferred Pharmacy:   [facility] - [place], [place] - [address], [place] [address] Phone: [phone]  Fax: [phone]

## 2023-04-15 ENCOUNTER — Encounter: Payer: Self-pay | Admitting: Allergy and Immunology

## 2023-04-15 ENCOUNTER — Ambulatory Visit: Payer: BC Managed Care – PPO | Admitting: Allergy and Immunology

## 2023-04-15 VITALS — BP 114/84 | HR 72 | Resp 16 | Ht 64.0 in | Wt 218.4 lb

## 2023-04-15 DIAGNOSIS — Z92241 Personal history of systemic steroid therapy: Secondary | ICD-10-CM | POA: Diagnosis not present

## 2023-04-15 DIAGNOSIS — J310 Chronic rhinitis: Secondary | ICD-10-CM

## 2023-04-15 DIAGNOSIS — L501 Idiopathic urticaria: Secondary | ICD-10-CM

## 2023-04-15 DIAGNOSIS — Z8262 Family history of osteoporosis: Secondary | ICD-10-CM

## 2023-04-15 NOTE — Progress Notes (Signed)
[place] [facility] [place] [facility] [place] [facility] [place]   Follow-up Note  Referring Provider: Sharlene Dory* Primary Provider: Sharlene Dory, DO Date of Office Visit: 04/15/2023  Subjective:   Lori Williamson (DOB: 02-19-1981) is a 42 y.o. female who [facility] in re-evaluation of the following:  HPI: Lori Williamson returns to this clinic in evaluation of chronic urticaria treated with omalizumab and allergic rhinitis.  I last saw her in this clinic 30 April 2022.  She continues to do very well with her current therapy which includes omalizumab tapered out to every 6 weeks without the redevelopment of any urticaria.  Her last complete tapering of omalizumab occurred in 2022 at which point in time she developed recrudescence of her severe urticaria within several months of eliminating this medication.  Her aim is to attempt every 8-week administration.  She has been on omalizumab since 2016 or 2017.  It is really made a significant difference regarding not just her urticaria but surprisingly her irritable bowel syndrome and also issues tied up with her atopic respiratory disease and this point she does not need to use any additional medicines to address these issues.  She does inform me that in the past she had to use lots of systemic steroids to treat recurrent sinusitis.  She does have a mom who has osteoporosis.  Allergies as of 04/15/2023       Reactions   Sulfa Antibiotics Nausea And Vomiting        Medication List    cetirizine 10 MG tablet Commonly known as: ZYRTEC Take 10 mg by mouth daily. Every evening   EpiPen 2-Pak 0.3 mg/0.3 mL Soaj injection Generic drug: EPINEPHrine Inject 0.3 mg into the muscle as needed for anaphylaxis.   escitalopram 10 MG tablet Commonly known as: Lexapro Take 1 tablet (10 mg total) by mouth daily.   famotidine 20 MG tablet Commonly known as: Pepcid Take 1 tablet (20 mg  total) by mouth 2 (two) times daily.   fluticasone 50 MCG/ACT nasal spray Commonly known as: FLONASE Place 1-2 sprays into both nostrils daily as needed for allergies.   hydrOXYzine 25 MG tablet Commonly known as: ATARAX Take 0.5-3 tablets (12.5-75 mg total) by mouth 3 (three) times daily as needed for anxiety.   meclizine 25 MG tablet Commonly known as: ANTIVERT Take 1 tablet (25 mg total) by mouth 3 (three) times daily as needed for dizziness.   Xolair 150 MG/ML prefilled syringe Generic drug: omalizumab INJECT THE CONTENTS OF 2 SYRINGES (300 MG) UNDER THE SKIN EVERY 28 DAYS        Past Medical History:  Diagnosis Date   Allergic rhinitis    Hypercholesterolemia    Urticaria     Past Surgical History:  Procedure Laterality Date   ARTHROGRAM Right 01/2020   CHOLECYSTECTOMY  2009    Review of systems negative except as noted in HPI / PMHx or noted below:  Review of Systems  Constitutional: Negative.   HENT: Negative.    Eyes: Negative.   Respiratory: Negative.    Cardiovascular: Negative.   Gastrointestinal: Negative.   Genitourinary: Negative.   Musculoskeletal: Negative.   Skin: Negative.   Neurological: Negative.   Endo/Heme/Allergies: Negative.   Psychiatric/Behavioral: Negative.       Objective:   Vitals:   04/15/23 0815  BP: 114/84  Pulse: 72  Resp: 16  SpO2: 98%   Height: 5\' 4"  (162.6 cm)  Weight: 218 lb  6.4 oz (99.1 kg)   Physical Exam Constitutional:      Appearance: She is not diaphoretic.  HENT:     Head: Normocephalic.     Right Ear: Tympanic membrane, ear canal and external ear normal.     Left Ear: Tympanic membrane, ear canal and external ear normal.     Nose: Nose normal. No mucosal edema or rhinorrhea.     Mouth/Throat:     Pharynx: Uvula midline. No oropharyngeal exudate.  Eyes:     Conjunctiva/sclera: Conjunctivae normal.  Neck:     Thyroid: No thyromegaly.     Trachea: Trachea normal. No tracheal tenderness or tracheal  deviation.  Cardiovascular:     Rate and Rhythm: Normal rate and regular rhythm.     Heart sounds: Normal heart sounds, S1 normal and S2 normal. No murmur heard. Pulmonary:     Effort: No respiratory distress.     Breath sounds: Normal breath sounds. No stridor. No wheezing or rales.  Lymphadenopathy:     Head:     Right side of head: No tonsillar adenopathy.     Left side of head: No tonsillar adenopathy.     Cervical: No cervical adenopathy.  Skin:    Findings: No erythema or rash.     Nails: There is no clubbing.  Neurological:     Mental Status: She is alert.     Diagnostics: none  Assessment and Plan:   1. Idiopathic urticaria   2. Other rhinitis   3. History of steroid therapy   4. Family history of osteoporosis    1.  Continue on the omalizumab every 4-8 weeks.     2.  If needed:  A. OTC antihistamine and Flonase B. EpiPen   3.  Discuss with primary doctor about bone density scan and vitamin D level   4.  Obtain fall flu vaccine  5.  Return to clinic in 1 year or earlier if problem  Lori Williamson is doing very well at this point in time on her current therapy which includes omalizumab and she will attempt to taper out her omalizumab to every 8-week administration.  Currently every 6-week administration is working very well.  I will see her back in this clinic in 1 year or earlier if there is a problem with this plan.  I did encourage her to discuss with her primary care doctor about obtaining a bone density scan and vitamin D level especially given the fact that she has had to use systemic steroids in the past to treat respiratory tract problems and her mom has osteoporosis.  Laurette Schimke, MD Allergy / Immunology [facility]

## 2023-04-15 NOTE — Patient Instructions (Addendum)
  1.  Continue on the omalizumab every 4-8 weeks.     2.  If needed:  A. OTC antihistamine and Flonase B. EpiPen   3.  Discuss with primary doctor about bone density scan and vitamin D level   4.  Obtain fall flu vaccine  5.  Return to clinic in 1 year or earlier if problem

## 2023-04-16 ENCOUNTER — Encounter: Payer: Self-pay | Admitting: Allergy and Immunology

## 2023-05-06 ENCOUNTER — Ambulatory Visit: Payer: Self-pay

## 2023-05-06 ENCOUNTER — Ambulatory Visit: Payer: BC Managed Care – PPO

## 2023-05-06 DIAGNOSIS — L501 Idiopathic urticaria: Secondary | ICD-10-CM | POA: Diagnosis not present

## 2023-05-23 ENCOUNTER — Encounter (INDEPENDENT_AMBULATORY_CARE_PROVIDER_SITE_OTHER): Payer: Self-pay

## 2023-05-28 ENCOUNTER — Telehealth: Payer: Self-pay | Admitting: Family Medicine

## 2023-05-28 NOTE — Telephone Encounter (Signed)
Please advise 

## 2023-05-28 NOTE — Telephone Encounter (Signed)
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 Please advise when orders have been placed.

## 2023-05-29 ENCOUNTER — Other Ambulatory Visit: Payer: Self-pay | Admitting: Family Medicine

## 2023-05-29 DIAGNOSIS — Z111 Encounter for screening for respiratory tuberculosis: Secondary | ICD-10-CM

## 2023-05-29 NOTE — Telephone Encounter (Signed)
TB skin test or TB blood test

## 2023-05-29 NOTE — Telephone Encounter (Signed)
Called and scheduled lab appt

## 2023-05-30 ENCOUNTER — Other Ambulatory Visit (INDEPENDENT_AMBULATORY_CARE_PROVIDER_SITE_OTHER): Payer: BC Managed Care – PPO

## 2023-05-30 DIAGNOSIS — Z111 Encounter for screening for respiratory tuberculosis: Secondary | ICD-10-CM | POA: Diagnosis not present

## 2023-05-30 NOTE — Telephone Encounter (Signed)
Form completed and emailed to [email or web address] Patient informed

## 2023-05-30 NOTE — Telephone Encounter (Signed)
Pt dropped off TB form after completing labs. She stated to let her know once the form has been signed and to send to her email.

## 2023-06-01 LAB — QUANTIFERON-TB GOLD PLUS
Mitogen-NIL: 5.5 [IU]/mL
NIL: 0.02 [IU]/mL
QuantiFERON-TB Gold Plus: NEGATIVE
TB1-NIL: 0 [IU]/mL
TB2-NIL: 0.01 [IU]/mL

## 2023-06-14 ENCOUNTER — Encounter: Payer: BC Managed Care – PPO | Admitting: Family Medicine

## 2023-06-17 ENCOUNTER — Ambulatory Visit (INDEPENDENT_AMBULATORY_CARE_PROVIDER_SITE_OTHER): Payer: BC Managed Care – PPO

## 2023-06-17 DIAGNOSIS — L501 Idiopathic urticaria: Secondary | ICD-10-CM

## 2023-06-24 ENCOUNTER — Ambulatory Visit (INDEPENDENT_AMBULATORY_CARE_PROVIDER_SITE_OTHER): Payer: BC Managed Care – PPO | Admitting: Family Medicine

## 2023-06-24 ENCOUNTER — Encounter: Payer: Self-pay | Admitting: Family Medicine

## 2023-06-24 ENCOUNTER — Other Ambulatory Visit: Payer: Self-pay | Admitting: Family Medicine

## 2023-06-24 VITALS — BP 118/70 | HR 77 | Temp 98.0°F | Ht 64.0 in | Wt 220.5 lb

## 2023-06-24 DIAGNOSIS — Z1231 Encounter for screening mammogram for malignant neoplasm of breast: Secondary | ICD-10-CM

## 2023-06-24 DIAGNOSIS — Z0000 Encounter for general adult medical examination without abnormal findings: Secondary | ICD-10-CM

## 2023-06-24 DIAGNOSIS — E785 Hyperlipidemia, unspecified: Secondary | ICD-10-CM

## 2023-06-24 DIAGNOSIS — Z92241 Personal history of systemic steroid therapy: Secondary | ICD-10-CM

## 2023-06-24 LAB — COMPREHENSIVE METABOLIC PANEL
ALT: 17 U/L (ref 0–35)
AST: 17 U/L (ref 0–37)
Albumin: 4 g/dL (ref 3.5–5.2)
Alkaline Phosphatase: 47 U/L (ref 39–117)
BUN: 13 mg/dL (ref 6–23)
CO2: 25 mEq/L (ref 19–32)
Calcium: 8.8 mg/dL (ref 8.4–10.5)
Chloride: 105 meq/L (ref 96–112)
Creatinine, Ser: 0.91 mg/dL (ref 0.40–1.20)
GFR: 78.32 mL/min (ref >60.00)
Glucose, Bld: 81 mg/dL (ref 70–99)
Potassium: 4 meq/L (ref 3.5–5.1)
Sodium: 138 meq/L (ref 135–145)
Total Bilirubin: 0.6 mg/dL (ref 0.2–1.2)
Total Protein: 6.2 g/dL (ref 6.0–8.3)

## 2023-06-24 LAB — LIPID PANEL
Cholesterol: 264 mg/dL — ABNORMAL HIGH (ref 0–200)
HDL: 48.6 mg/dL (ref >39.00)
LDL Cholesterol: 187 mg/dL — ABNORMAL HIGH (ref 0–99)
NonHDL: 214.99
Total CHOL/HDL Ratio: 5
Triglycerides: 138 mg/dL (ref 0.0–149.0)
VLDL: 27.6 mg/dL (ref 0.0–40.0)

## 2023-06-24 LAB — CBC
HCT: 42 % (ref 36.0–46.0)
Hemoglobin: 13.9 g/dL (ref 12.0–15.0)
MCHC: 33.1 g/dL (ref 30.0–36.0)
MCV: 92 fl (ref 78.0–100.0)
Platelets: 335 10*3/uL (ref 150.0–400.0)
RBC: 4.57 Mil/uL (ref 3.87–5.11)
RDW: 12.2 % (ref 11.5–15.5)
WBC: 6.8 10*3/uL (ref 4.0–10.5)

## 2023-06-24 MED ORDER — ROSUVASTATIN CALCIUM 10 MG PO TABS: 10.0000 mg | ORAL_TABLET | Freq: Every day | ORAL | 3 refills | Status: DC

## 2023-06-24 NOTE — Progress Notes (Signed)
Chief Complaint  Patient presents with   Annual Exam    Discuss cholesterol medication     Well Woman Lori Williamson is here for a complete physical.   Her last physical was >1 year ago.  Current diet: in general, diet could be better.  Current exercise: stretching. Weight is stable and she denies fatigue out of ordinary. Seatbelt? Yes Advanced directive? Yes  Health Maintenance Pap/HPV- Due Mammogram- No Tetanus- Yes Hep C screening- Yes HIV screening- Yes  Past Medical History:  Diagnosis Date   Allergic rhinitis    Hypercholesterolemia      Past Surgical History:  Procedure Laterality Date   ARTHROGRAM Right 01/2020   CHOLECYSTECTOMY  2009    Medications  Current Outpatient Medications on File Prior to Visit  Medication Sig Dispense Refill   cetirizine (ZYRTEC) 10 MG tablet Take 10 mg by mouth daily. Every evening     EPINEPHrine (EPIPEN 2-PAK) 0.3 mg/0.3 mL IJ SOAJ injection Inject 0.3 mg into the muscle as needed for anaphylaxis.     escitalopram (LEXAPRO) 10 MG tablet Take 1 tablet (10 mg total) by mouth daily. 90 tablet 3   famotidine (PEPCID) 20 MG tablet Take 1 tablet (20 mg total) by mouth 2 (two) times daily. 60 tablet 5   fluticasone (FLONASE) 50 MCG/ACT nasal spray Place 1-2 sprays into both nostrils daily as needed for allergies. 16 mL 5   hydrOXYzine (ATARAX) 25 MG tablet Take 0.5-3 tablets (12.5-75 mg total) by mouth 3 (three) times daily as needed for anxiety. 60 tablet 2   XOLAIR 150 MG/ML prefilled syringe INJECT THE CONTENTS OF 2 SYRINGES (300 MG) UNDER THE SKIN EVERY 28 DAYS 2 mL 11   Current Facility-Administered Medications on File Prior to Visit  Medication Dose Route Frequency Provider Last Rate Last Admin   omalizumab [name]) injection 300 mg  300 mg Subcutaneous Q28 days Stephannie Li A, MD   300 mg at 06/17/23 0902     Allergies Allergies  Allergen Reactions   Sulfa Antibiotics Nausea And Vomiting    Review of  Systems: Constitutional:  no unexpected weight changes Eye:  no recent significant change in vision Ear/Nose/Mouth/Throat:  Ears:  no recent change in hearing Nose/Mouth/Throat:  no complaints of nasal congestion, no sore throat Cardiovascular: no chest pain Respiratory:  no shortness of breath Gastrointestinal:  no abdominal pain, no change in bowel habits GU:  Female: negative for dysuria or pelvic pain Musculoskeletal/Extremities:  no new pain of the joints Integumentary (Skin/Breast):  no abnormal skin lesions reported Neurologic:  no headaches Endocrine:  denies fatigue Hematologic/Lymphatic:  No areas of easy bleeding  Exam BP 118/70 (BP Location: Left Arm, Patient Position: Sitting, Cuff Size: Large)   Pulse 77   Temp 98 F (36.7 C) (Oral)   Ht 5\' 4"  (1.626 m)   Wt 220 lb 8 oz (100 kg)   SpO2 98%   BMI 37.85 kg/m  General:  well developed, well nourished, in no apparent distress Skin:  no significant moles, warts, or growths Head:  no masses, lesions, or tenderness Eyes:  pupils equal and round, sclera anicteric without injection Ears:  canals without lesions, TMs shiny without retraction, no obvious effusion, no erythema Nose:  nares patent, mucosa normal, and no drainage Throat/Pharynx:  lips and gingiva without lesion; tongue and uvula midline; non-inflamed pharynx; no exudates or postnasal drainage Neck: neck supple without adenopathy, thyromegaly, or masses Lungs:  clear to auscultation, breath sounds equal bilaterally, no respiratory distress Cardio:  regular rate and rhythm, no LE edema Abdomen:  abdomen soft, nontender; bowel sounds normal; no masses or organomegaly Genital: Defer to GYN Musculoskeletal:  symmetrical muscle groups noted without atrophy or deformity Extremities:  no clubbing, cyanosis, or edema, no deformities, no skin discoloration Neuro:  gait normal; deep tendon reflexes normal and symmetric Psych: well oriented with normal range of affect and  appropriate judgment/insight  Assessment and Plan  Well adult exam - Plan: CBC, Comprehensive metabolic panel, Lipid panel  Encounter for screening mammogram for malignant neoplasm of breast - Plan: MM DIGITAL SCREENING BILATERAL  History of steroid therapy - Plan: DG Bone Density   Well 42 y.o. female. Counseled on diet and exercise. Advanced directive form requested today.  Other orders as above. Follow up in 6 mo. The patient voiced understanding and agreement to the plan.  Jilda Roche [place], DO 06/24/23 7:37 AM

## 2023-06-24 NOTE — Patient Instructions (Addendum)
Give [place] 2-3 business days to get the results of your labs back.   Keep the diet clean and stay active.  Please get me a copy of your advanced directive form at your convenience.   I recommend getting the flu shot in mid October. This suggestion would change if the CDC comes out with a different recommendation.   Hold your stretches for at least 30 seconds.   Please schedule your GYN exam.   Let us know if you need anything.

## 2023-07-10 ENCOUNTER — Ambulatory Visit (HOSPITAL_BASED_OUTPATIENT_CLINIC_OR_DEPARTMENT_OTHER)
Admission: RE | Admit: 2023-07-10 | Discharge: 2023-07-10 | Disposition: A | Discharge status: Home / Self Care | Payer: BC Managed Care – PPO | Source: Ambulatory Visit | Attending: Family Medicine | Admitting: Family Medicine

## 2023-07-10 ENCOUNTER — Encounter (HOSPITAL_BASED_OUTPATIENT_CLINIC_OR_DEPARTMENT_OTHER): Payer: Self-pay

## 2023-07-10 DIAGNOSIS — Z1231 Encounter for screening mammogram for malignant neoplasm of breast: Secondary | ICD-10-CM | POA: Insufficient documentation

## 2023-07-10 DIAGNOSIS — Z92241 Personal history of systemic steroid therapy: Secondary | ICD-10-CM | POA: Diagnosis present

## 2023-07-12 ENCOUNTER — Other Ambulatory Visit: Payer: Self-pay | Admitting: Family Medicine

## 2023-07-12 DIAGNOSIS — R928 Other abnormal and inconclusive findings on diagnostic imaging of breast: Secondary | ICD-10-CM

## 2023-07-26 ENCOUNTER — Ambulatory Visit
Admission: RE | Admit: 2023-07-26 | Discharge: 2023-07-26 | Disposition: A | Discharge status: Home / Self Care | Payer: BC Managed Care – PPO | Source: Ambulatory Visit | Attending: Family Medicine | Admitting: Family Medicine

## 2023-07-26 DIAGNOSIS — R928 Other abnormal and inconclusive findings on diagnostic imaging of breast: Secondary | ICD-10-CM

## 2023-07-29 ENCOUNTER — Ambulatory Visit: Payer: BC Managed Care – PPO

## 2023-07-29 ENCOUNTER — Ambulatory Visit (INDEPENDENT_AMBULATORY_CARE_PROVIDER_SITE_OTHER): Payer: BC Managed Care – PPO

## 2023-07-29 DIAGNOSIS — F9829 Other feeding disorders of infancy and early childhood: Secondary | ICD-10-CM

## 2023-07-29 DIAGNOSIS — Z538 Procedure and treatment not carried out for other reasons: Secondary | ICD-10-CM

## 2023-07-29 DIAGNOSIS — L501 Idiopathic urticaria: Secondary | ICD-10-CM | POA: Diagnosis not present

## 2023-08-05 ENCOUNTER — Other Ambulatory Visit (INDEPENDENT_AMBULATORY_CARE_PROVIDER_SITE_OTHER): Payer: BC Managed Care – PPO

## 2023-08-05 DIAGNOSIS — E785 Hyperlipidemia, unspecified: Secondary | ICD-10-CM | POA: Diagnosis not present

## 2023-08-05 LAB — LIPID PANEL
Cholesterol: 134 mg/dL (ref 0–200)
HDL: 47.5 mg/dL (ref >39.00)
LDL Cholesterol: 69 mg/dL (ref 0–99)
NonHDL: 86.6
Total CHOL/HDL Ratio: 3
Triglycerides: 87 mg/dL (ref 0.0–149.0)
VLDL: 17.4 mg/dL (ref 0.0–40.0)

## 2023-08-05 LAB — HEPATIC FUNCTION PANEL
ALT: 19 U/L (ref 0–35)
AST: 18 U/L (ref 0–37)
Albumin: 4 g/dL (ref 3.5–5.2)
Alkaline Phosphatase: 48 U/L (ref 39–117)
Bilirubin, Direct: 0.1 mg/dL (ref 0.0–0.3)
Total Bilirubin: 0.4 mg/dL (ref 0.2–1.2)
Total Protein: 6.1 g/dL (ref 6.0–8.3)

## 2023-09-03 ENCOUNTER — Ambulatory Visit (INDEPENDENT_AMBULATORY_CARE_PROVIDER_SITE_OTHER): Payer: BC Managed Care – PPO

## 2023-09-03 DIAGNOSIS — L501 Idiopathic urticaria: Secondary | ICD-10-CM

## 2023-10-14 ENCOUNTER — Ambulatory Visit: Payer: BC Managed Care – PPO

## 2023-10-14 DIAGNOSIS — L501 Idiopathic urticaria: Secondary | ICD-10-CM | POA: Diagnosis not present

## 2023-10-15 ENCOUNTER — Ambulatory Visit: Payer: BC Managed Care – PPO

## 2023-10-21 ENCOUNTER — Other Ambulatory Visit: Payer: Self-pay | Admitting: Allergy

## 2023-11-04 ENCOUNTER — Encounter: Payer: Self-pay | Admitting: Family Medicine

## 2023-11-05 MED ORDER — ROSUVASTATIN CALCIUM 10 MG PO TABS: 10.0000 mg | ORAL_TABLET | Freq: Every day | ORAL | 1 refills | Status: DC

## 2023-11-18 ENCOUNTER — Ambulatory Visit: Payer: BC Managed Care – PPO

## 2023-11-18 DIAGNOSIS — L501 Idiopathic urticaria: Secondary | ICD-10-CM

## 2023-12-23 ENCOUNTER — Ambulatory Visit: Payer: BC Managed Care – PPO | Admitting: Family Medicine

## 2023-12-23 ENCOUNTER — Encounter: Payer: Self-pay | Admitting: Family Medicine

## 2023-12-23 VITALS — BP 107/64 | HR 70 | Temp 98.7°F | Resp 16 | Ht 64.0 in | Wt 214.0 lb

## 2023-12-23 DIAGNOSIS — F325 Major depressive disorder, single episode, in full remission: Secondary | ICD-10-CM | POA: Insufficient documentation

## 2023-12-23 DIAGNOSIS — F411 Generalized anxiety disorder: Secondary | ICD-10-CM

## 2023-12-23 DIAGNOSIS — E7800 Pure hypercholesterolemia, unspecified: Secondary | ICD-10-CM | POA: Insufficient documentation

## 2023-12-23 LAB — COMPREHENSIVE METABOLIC PANEL
ALT: 14 U/L (ref 0–35)
AST: 14 U/L (ref 0–37)
Albumin: 4.4 g/dL (ref 3.5–5.2)
Alkaline Phosphatase: 51 U/L (ref 39–117)
BUN: 12 mg/dL (ref 6–23)
CO2: 25 meq/L (ref 19–32)
Calcium: 9.3 mg/dL (ref 8.4–10.5)
Chloride: 106 meq/L (ref 96–112)
Creatinine, Ser: 0.9 mg/dL (ref 0.40–1.20)
GFR: 79.09 mL/min (ref >60.00)
Glucose, Bld: 81 mg/dL (ref 70–99)
Potassium: 4.3 meq/L (ref 3.5–5.1)
Sodium: 139 meq/L (ref 135–145)
Total Bilirubin: 0.7 mg/dL (ref 0.2–1.2)
Total Protein: 6.5 g/dL (ref 6.0–8.3)

## 2023-12-23 LAB — LIPID PANEL
Cholesterol: 138 mg/dL (ref 0–200)
HDL: 44.6 mg/dL (ref >39.00)
LDL Cholesterol: 77 mg/dL (ref 0–99)
NonHDL: 93.07
Total CHOL/HDL Ratio: 3
Triglycerides: 78 mg/dL (ref 0.0–149.0)
VLDL: 15.6 mg/dL (ref 0.0–40.0)

## 2023-12-23 NOTE — Patient Instructions (Signed)
Keep the diet clean and stay active. ° °Give us 2-3 business days to get the results of your labs back.  ° °Keep the diet clean and stay active. ° °

## 2023-12-23 NOTE — Progress Notes (Signed)
 Chief Complaint  Patient presents with   Hyperlipidemia    Here for follow up    Subjective Lori Williamson presents for f/u anxiety/depression.  Pt is currently being treated with Lexapro 10 mg/d.  Reports doing since treatment. No thoughts of harming self or others. No self-medication with alcohol, prescription drugs or illicit drugs. Pt is not following with a counselor/psychologist.  Hyperlipidemia Patient presents for hyperlipidemia follow up. Currently being treated with Crestor 10 mg/d and compliance with treatment thus far has been good. She denies myalgias. She is adhering to a healthy diet. Exercise: some walking No CP or SOB.  The patient is not known to have coexisting coronary artery disease.  Past Medical History:  Diagnosis Date   Allergic rhinitis    Hypercholesterolemia    Allergies as of 12/23/2023       Reactions   Sulfa Antibiotics Nausea And Vomiting        Medication List        Accurate as of December 23, 2023  7:06 AM. If you have any questions, ask your nurse or doctor.          cetirizine 10 MG tablet Commonly known as: ZYRTEC Take 10 mg by mouth daily. Every evening   EpiPen 2-Pak 0.3 mg/0.3 mL Soaj injection Generic drug: EPINEPHrine Inject 0.3 mg into the muscle as needed for anaphylaxis.   escitalopram 10 MG tablet Commonly known as: Lexapro Take 1 tablet (10 mg total) by mouth daily.   famotidine 20 MG tablet Commonly known as: Pepcid Take 1 tablet (20 mg total) by mouth 2 (two) times daily.   fluticasone 50 MCG/ACT nasal spray Commonly known as: FLONASE Place 1-2 sprays into both nostrils daily as needed for allergies.   hydrOXYzine 25 MG tablet Commonly known as: ATARAX Take 0.5-3 tablets (12.5-75 mg total) by mouth 3 (three) times daily as needed for anxiety.   rosuvastatin 10 MG tablet Commonly known as: Crestor Take 1 tablet (10 mg total) by mouth daily.   Xolair 150 MG/ML prefilled syringe Generic  drug: omalizumab INJECT THE CONTENTS OF 2 SYRINGES (300 MG) UNDER THE SKIN EVERY 28 DAYS        Exam BP 107/64 (BP Location: Right Arm, Patient Position: Sitting, Cuff Size: Large)   Pulse 70   Temp 98.7 F (37.1 C) (Oral)   Resp 16   Ht 5\' 4"  (1.626 m)   Wt 214 lb (97.1 kg)   SpO2 97%   BMI 36.73 kg/m  General:  well developed, well nourished, in no apparent distress Heart: RRR, no bruits, no LE edema Lungs:  CTAB. No respiratory distress Psych: well oriented with normal range of affect and age-appropriate judgement/insight, alert and oriented x4.  Assessment and Plan  GAD (generalized anxiety disorder)  Major depressive disorder with single episode, in full remission (HCC)  Pure hypercholesterolemia  1/2.  Chronic, stable.  Continue optimal 10 mg daily. 3.  Chronic, stable.  Continue Crestor 10 mg daily.  Counseled on diet and exercise.  Check labs today. F/u in 6 mo. The patient voiced understanding and agreement to the plan.  Jilda Roche [place], DO 12/23/23 7:06 AM

## 2023-12-30 ENCOUNTER — Ambulatory Visit: Payer: BC Managed Care – PPO

## 2023-12-30 DIAGNOSIS — L501 Idiopathic urticaria: Secondary | ICD-10-CM | POA: Diagnosis not present

## 2024-02-10 ENCOUNTER — Ambulatory Visit

## 2024-02-10 DIAGNOSIS — L501 Idiopathic urticaria: Secondary | ICD-10-CM | POA: Diagnosis not present

## 2024-03-23 ENCOUNTER — Ambulatory Visit (INDEPENDENT_AMBULATORY_CARE_PROVIDER_SITE_OTHER)

## 2024-03-23 DIAGNOSIS — L501 Idiopathic urticaria: Secondary | ICD-10-CM | POA: Diagnosis not present

## 2024-04-02 ENCOUNTER — Encounter: Payer: Self-pay | Admitting: Internal Medicine

## 2024-04-02 ENCOUNTER — Ambulatory Visit: Admitting: Internal Medicine

## 2024-04-02 VITALS — BP 108/68 | HR 78 | Temp 98.1°F | Resp 17 | Ht 63.9 in | Wt 220.0 lb

## 2024-04-02 DIAGNOSIS — J310 Chronic rhinitis: Secondary | ICD-10-CM | POA: Diagnosis not present

## 2024-04-02 DIAGNOSIS — L508 Other urticaria: Secondary | ICD-10-CM | POA: Diagnosis not present

## 2024-04-02 MED ORDER — EPINEPHRINE 0.3 MG/0.3ML IJ SOAJ: 0.3000 mg | INTRAMUSCULAR | 1 refills | Status: AC | PRN

## 2024-04-02 NOTE — Progress Notes (Signed)
 FOLLOW UP Date of Service/Encounter:   04/02/2024  Subjective:  Lori Williamson (DOB: 1981-02-13) is a 43 y.o. female who [facility] in re-evaluation of the following: Chronic urticaria and allergic rhinitis History obtained from: chart review and patient.  For Review, LV was on 04/15/23  with Dr. Maurilio seen for routine follow-up. See below for summary of history and diagnostics.   Therapeutic plans/changes recommended: At that visit she was doing well on a Melisa Mab every 6 weeks without development of urticaria.  She did taper in 2022 but had a severe urticaria flare lasting several months.  Her goal would be to get to every 8-week administration.  She has been on oh Melissa Mab since around 2016 or 2017. Not only did help with her urticaria, but has help with her IBS as well as atopic respiratory disease.  No longer using any other as needed medications for these 2 conditions.  Today presents for follow-up.  History of Present Illness   Lori Williamson is a 43 year old female with chronic hives who presents for follow-up on Xolair  treatment.  Chronic urticaria - Chronic hives present since 2016-2017 - Symptoms include severe burning and itching during flares - Xolair  injections every six weeks provide effective symptom control - Attempted taper off Xolair  in 2022 resulted in recurrence of severe hives after six months - During recurrence, relied on over-the-counter medications until Xolair  was restarted - No trials of extending Xolair  interval to seven or eight weeks, but she would be open to trying  Recurrent sinopulmonary infections - History of frequent sinus and respiratory infections prior to Xolair  initiation - Infections required antibiotics and prednisone for management - Frequency of infections has decreased since starting Xolair   Allergy  evaluation and laboratory findings - Extensive skin and blood testing  failed to identify specific allergens  Medication administration preferences - Background as a former Pharmacologist - Comfortable with self-administration of injections if necessary       All medications reviewed by clinical staff and updated in chart. No new pertinent medical or surgical history except as noted in HPI.  ROS: All others negative except as noted per HPI.   Objective:  BP 108/68   Pulse 78   Temp 98.1 F (36.7 C) (Temporal)   Resp 17   Ht 5' 3.9 (1.623 m)   Wt 220 lb (99.8 kg)   SpO2 97%   BMI 37.88 kg/m  Body mass index is 37.88 kg/m. Physical Exam: General Appearance:  Alert, cooperative, no distress, appears stated age  Head:  Normocephalic, without obvious abnormality, atraumatic  Eyes:  Conjunctiva clear, EOM's intact  Ears EACs normal bilaterally and normal TMs bilaterally  Nose: Nares normal, hypertrophic turbinates, normal mucosa, and no visible anterior polyps  Throat: Lips, tongue normal; teeth and gums normal, normal posterior oropharynx  Neck: Supple, symmetrical  Lungs:   clear to auscultation bilaterally, Respirations unlabored, no coughing  Heart:  regular rate and rhythm and no murmur, Appears well perfused  Extremities: No edema  Skin: Skin color, texture, turgor normal and no rashes or lesions on visualized portions of skin  Neurologic: No gross deficits   Labs:  Lab Orders  No laboratory test(s) ordered today   Assessment/Plan   Assessment and Plan    Chronic spontaneous urticaria Chronic spontaneous urticaria effectively managed with Xolair  every six weeks. Previous tapering attempt failed. 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 - Consider spacing  Xolair  injections to every eight weeks cautiously. - Monitor for symptom return if injections are spaced. - Reassess in six months or sooner if symptoms return. -EpiPen  sent-to be carried on days of Xolair  injections per  protocol.  Frequent infections/rhinitis Reduced frequency of sinus and respiratory infections likely due to Xolair 's management of allergic responses.  - Monitor for infection/rhinitis symptoms return if Xolair  is spaced. - Consider allergy  injections for seasonal allergies if Xolair  is discontinued. Would need to be off Xolair  for 3 months prior to retesting.  Recording duration: 19 minutes      Follow up : 6 months, sooner if needed. If doing well, consider spacing out Xolair  again. It was a pleasure meeting you in clinic today! Thank you for allowing me to participate in your care.  Rocky Endow, MD [facility]   Other: none  Rocky Endow, MD  [facility] 

## 2024-04-02 NOTE — Patient Instructions (Signed)
 Chronic spontaneous urticaria Chronic spontaneous urticaria effectively managed with Xolair  every six weeks. Previous tapering attempt failed. 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 - Consider spacing Xolair  injections to every eight weeks cautiously. - Monitor for symptom return if injections are spaced. - Reassess in six months or sooner if symptoms return.  Frequent infections/rhinitis Reduced frequency of sinus and respiratory infections likely due to Xolair 's management of allergic responses.  - Monitor for infection/rhinitis symptoms return if Xolair  is spaced. - Consider allergy  injections for seasonal allergies if Xolair  is discontinued. Would need to be off Xolair  for 3 months prior to retesting.  Recording duration: 19 minutes      Follow up : 6 months, sooner if needed. If doing well, consider spacing out Xolair  again. It was a pleasure meeting you in clinic today! Thank you for allowing me to participate in your care.

## 2024-04-06 ENCOUNTER — Telehealth: Payer: Self-pay | Admitting: *Deleted

## 2024-04-06 MED ORDER — XOLAIR 300 MG/2ML ~~LOC~~ SOSY: 300.0000 mg | PREFILLED_SYRINGE | SUBCUTANEOUS | 11 refills | Status: AC

## 2024-04-06 NOTE — Telephone Encounter (Signed)
-----   Message from Rocky LOISE Endow sent at 04/02/2024 12:16 PM EDT ----- She would like to switch to home dosing if possible.

## 2024-04-06 NOTE — Telephone Encounter (Signed)
 Called patient and advised approval for 300mg  syringe every 28 days rx to [facility] and attestation for home admin. Instructed on dosing, delivery, storage and ordering.

## 2024-05-04 ENCOUNTER — Ambulatory Visit

## 2024-06-24 ENCOUNTER — Encounter: Payer: Self-pay | Admitting: Family Medicine

## 2024-06-24 ENCOUNTER — Ambulatory Visit: Payer: Self-pay | Admitting: Family Medicine

## 2024-06-24 ENCOUNTER — Ambulatory Visit (INDEPENDENT_AMBULATORY_CARE_PROVIDER_SITE_OTHER): Admitting: Family Medicine

## 2024-06-24 VITALS — BP 110/78 | HR 86 | Temp 98.0°F | Resp 16 | Ht 63.0 in | Wt 222.0 lb

## 2024-06-24 DIAGNOSIS — F411 Generalized anxiety disorder: Secondary | ICD-10-CM

## 2024-06-24 DIAGNOSIS — Z0000 Encounter for general adult medical examination without abnormal findings: Secondary | ICD-10-CM | POA: Diagnosis not present

## 2024-06-24 DIAGNOSIS — F321 Major depressive disorder, single episode, moderate: Secondary | ICD-10-CM

## 2024-06-24 LAB — COMPREHENSIVE METABOLIC PANEL WITH GFR
ALT: 12 U/L (ref 0–35)
AST: 14 U/L (ref 0–37)
Albumin: 4.4 g/dL (ref 3.5–5.2)
Alkaline Phosphatase: 49 U/L (ref 39–117)
BUN: 15 mg/dL (ref 6–23)
CO2: 27 meq/L (ref 19–32)
Calcium: 9.5 mg/dL (ref 8.4–10.5)
Chloride: 106 meq/L (ref 96–112)
Creatinine, Ser: 0.92 mg/dL (ref 0.40–1.20)
GFR: 76.76 mL/min (ref >60.00)
Glucose, Bld: 85 mg/dL (ref 70–99)
Potassium: 4.4 meq/L (ref 3.5–5.1)
Sodium: 139 meq/L (ref 135–145)
Total Bilirubin: 0.6 mg/dL (ref 0.2–1.2)
Total Protein: 6.6 g/dL (ref 6.0–8.3)

## 2024-06-24 LAB — CBC
HCT: 41.6 % (ref 36.0–46.0)
Hemoglobin: 14.1 g/dL (ref 12.0–15.0)
MCHC: 33.8 g/dL (ref 30.0–36.0)
MCV: 91.2 fl (ref 78.0–100.0)
Platelets: 283 K/uL (ref 150.0–400.0)
RBC: 4.56 Mil/uL (ref 3.87–5.11)
RDW: 12.1 % (ref 11.5–15.5)
WBC: 7.9 K/uL (ref 4.0–10.5)

## 2024-06-24 LAB — LIPID PANEL
Cholesterol: 159 mg/dL (ref 0–200)
HDL: 47.9 mg/dL (ref >39.00)
LDL Cholesterol: 98 mg/dL (ref 0–99)
NonHDL: 110.99
Total CHOL/HDL Ratio: 3
Triglycerides: 64 mg/dL (ref 0.0–149.0)
VLDL: 12.8 mg/dL (ref 0.0–40.0)

## 2024-06-24 MED ORDER — ESCITALOPRAM OXALATE 10 MG PO TABS: 10.0000 mg | ORAL_TABLET | Freq: Every day | ORAL | 3 refills | Status: AC

## 2024-06-24 NOTE — Patient Instructions (Addendum)
 Give us  2-3 business days to get the results of your labs back.   Keep the diet clean and stay active.  Please get me a copy of your advanced directive form at your convenience.   Call Center for [facility] at [phone] for an appointment.  They are located at [address], [place], [place], [address] (right across the hall from our office).  Take 1/2 tab of your Lexapro  for the 1st 2 weeks and then back to a full tab.   Please consider counseling. Contact [phone] to schedule an appointment or inquire about cost/insurance coverage.  [facility] located at [address], [address], [place], [place].  Phone number = [phone].  Dr. Verdel Silk - Adult Psychiatry.    [facility] located at [address] [place], [place], [place]. Phone number = [phone].   [facility] located at [address] [place], [place].  Phone number = [phone].   [facility] located at [address] [place], [place], [place].  Phone number = [phone].  Let us  know if you need anything.

## 2024-06-24 NOTE — Progress Notes (Signed)
 Chief Complaint  Patient presents with   Annual Exam    CPE     Well Woman Lori Williamson is here for a complete physical.   Her last physical was >1 year ago.  Current diet: in general, diet could be better. Current exercise: some walking. Weight is stable and she confirms some fatigue.  Seatbelt? Yes Advanced directive? No  Health Maintenance Pap/HPV- Due Mammogram- Yes Tetanus- Yes Hep C screening- Yes HIV screening- Yes  Past Medical History:  Diagnosis Date   Allergic rhinitis    Hypercholesterolemia      Past Surgical History:  Procedure Laterality Date   ARTHROGRAM Right 01/2020   CHOLECYSTECTOMY  2009    Medications  Current Outpatient Medications on File Prior to Visit  Medication Sig Dispense Refill   cetirizine (ZYRTEC) 10 MG tablet Take 10 mg by mouth daily. Every evening     EPINEPHrine  (EPIPEN  2-PAK) 0.3 mg/0.3 mL IJ SOAJ injection Inject 0.3 mg into the muscle as needed for anaphylaxis. Continue to carry with you on days of Xolair  injections. 1 each 1   famotidine  (PEPCID ) 20 MG tablet Take 1 tablet (20 mg total) by mouth 2 (two) times daily. 60 tablet 5   fluticasone  (FLONASE ) 50 MCG/ACT nasal spray Place 1-2 sprays into both nostrils daily as needed for allergies. 16 mL 5   hydrOXYzine  (ATARAX ) 25 MG tablet Take 0.5-3 tablets (12.5-75 mg total) by mouth 3 (three) times daily as needed for anxiety. 60 tablet 2   omalizumab  (XOLAIR ) 300 MG/2  ML prefilled syringe Inject 300 mg into the skin every 28 (twenty-eight) days. 2 mL 11   rosuvastatin  (CRESTOR ) 10 MG tablet Take 1 tablet (10 mg total) by mouth daily. 90 tablet 1   Current Facility-Administered Medications on File Prior to Visit  Medication Dose Route Frequency Provider Last Rate Last Admin   omalizumab  (XOLAIR ) injection 300 mg  300 mg Subcutaneous Q28 days Asa Aloysius LABOR, MD   300 mg at 03/23/24 0909     Allergies Allergies  Allergen Reactions   Sulfa Antibiotics Nausea And  Vomiting    Review of Systems: Constitutional:  no unexpected weight changes Eye:  no recent significant change in vision Ear/Nose/Mouth/Throat:  Ears:  no recent change in hearing Nose/Mouth/Throat:  no complaints of nasal congestion, no sore throat Cardiovascular: no chest pain Respiratory:  no shortness of breath Gastrointestinal:  no abdominal pain, no change in bowel habits GU:  Female: negative for dysuria or pelvic pain Musculoskeletal/Extremities:  no pain of the joints Integumentary (Skin/Breast):  no abnormal skin lesions reported Neurologic:  no headaches Endocrine:  denies fatigue Hematologic/Lymphatic:  No areas of easy bleeding  Exam BP 110/78 (BP Location: Left Arm, Patient Position: Sitting)   Pulse 86   Temp 98 F (36.7 C) (Oral)   Resp 16   Ht 5' 3 (1.6 m)   Wt 222 lb (100.7 kg)   SpO2 97%   BMI 39.33 kg/m  General:  well developed, well nourished, in no apparent distress Skin:  no significant moles, warts, or growths Head:  no masses, lesions, or tenderness Eyes:  pupils equal and round, sclera anicteric without injection Ears:  canals without lesions, TMs shiny without retraction, no obvious effusion, no erythema Nose:  nares patent, mucosa normal, and no drainage Throat/Pharynx:  lips and gingiva without lesion; tongue and uvula midline; non-inflamed pharynx; no exudates or postnasal drainage Neck: neck supple without adenopathy, thyromegaly, or masses Lungs:  clear to auscultation, breath sounds equal  bilaterally, no respiratory distress Cardio:  regular rate and rhythm, no LE edema Abdomen:  abdomen soft, nontender; bowel sounds normal; no masses or organomegaly Genital: Defer to GYN Musculoskeletal:  symmetrical muscle groups noted without atrophy or deformity Extremities:  no clubbing, cyanosis, or edema, no deformities, no skin discoloration Neuro:  gait normal; deep tendon reflexes normal and symmetric Psych: well oriented with normal range of  affect and appropriate judgment/insight  Assessment and Plan  Well adult exam - Plan: CBC, Comprehensive metabolic panel with GFR, Lipid panel  GAD (generalized anxiety disorder) - Plan: escitalopram  (LEXAPRO ) 10 MG tablet  Depression, major, single episode, moderate (HCC) - Plan: escitalopram  (LEXAPRO ) 10 MG tablet   Well 43 y.o. female. Counseled on diet and exercise. Advanced directive form requested today.  GYN info provided. She will get the flu shot in mid Oct.  Other orders as above. Follow up in 6 mo. The patient voiced understanding and agreement to the plan.  Mabel [place], DO 06/24/24 7:39 AM

## 2024-06-28 ENCOUNTER — Encounter: Payer: Self-pay | Admitting: Family Medicine

## 2024-06-29 MED ORDER — ROSUVASTATIN CALCIUM 10 MG PO TABS: 10.0000 mg | ORAL_TABLET | Freq: Every day | ORAL | 1 refills | Status: AC

## 2024-12-21 ENCOUNTER — Ambulatory Visit: Admitting: Family Medicine
